# Patient Record
Sex: Female | Born: 1970 | Race: White | Hispanic: No | Marital: Single | Smoking: Current every day smoker
Health system: Southern US, Community
[De-identification: ages and names within clinical notes are randomized; demographics above are authoritative.]

## PROBLEM LIST (undated history)

## (undated) DIAGNOSIS — Z8639 Personal history of other endocrine, nutritional and metabolic disease: Secondary | ICD-10-CM

## (undated) DIAGNOSIS — G4730 Sleep apnea, unspecified: Secondary | ICD-10-CM

## (undated) DIAGNOSIS — I2699 Other pulmonary embolism without acute cor pulmonale: Secondary | ICD-10-CM

## (undated) DIAGNOSIS — M1990 Unspecified osteoarthritis, unspecified site: Secondary | ICD-10-CM

## (undated) DIAGNOSIS — M797 Fibromyalgia: Secondary | ICD-10-CM

## (undated) DIAGNOSIS — J449 Chronic obstructive pulmonary disease, unspecified: Secondary | ICD-10-CM

## (undated) DIAGNOSIS — M069 Rheumatoid arthritis, unspecified: Secondary | ICD-10-CM

## (undated) HISTORY — PX: ABDOMINAL HYSTERECTOMY: SHX81

## (undated) HISTORY — PX: OTHER SURGICAL HISTORY: SHX169

---

## 1999-04-25 DIAGNOSIS — Z86718 Personal history of other venous thrombosis and embolism: Secondary | ICD-10-CM | POA: Insufficient documentation

## 1999-04-25 DIAGNOSIS — I2699 Other pulmonary embolism without acute cor pulmonale: Secondary | ICD-10-CM

## 1999-04-25 HISTORY — DX: Other pulmonary embolism without acute cor pulmonale: I26.99

## 2004-06-17 ENCOUNTER — Ambulatory Visit: Payer: Self-pay | Admitting: Obstetrics and Gynecology

## 2004-07-08 ENCOUNTER — Ambulatory Visit: Payer: Self-pay | Admitting: Obstetrics and Gynecology

## 2005-10-10 ENCOUNTER — Emergency Department: Payer: Self-pay | Admitting: Emergency Medicine

## 2008-09-25 ENCOUNTER — Ambulatory Visit: Payer: Self-pay | Admitting: Internal Medicine

## 2009-03-17 ENCOUNTER — Emergency Department: Payer: Self-pay | Admitting: Emergency Medicine

## 2011-03-14 ENCOUNTER — Ambulatory Visit: Payer: Self-pay | Admitting: Family Medicine

## 2014-01-08 DIAGNOSIS — M79641 Pain in right hand: Secondary | ICD-10-CM | POA: Insufficient documentation

## 2014-01-08 DIAGNOSIS — M222X1 Patellofemoral disorders, right knee: Secondary | ICD-10-CM | POA: Insufficient documentation

## 2014-01-08 DIAGNOSIS — R202 Paresthesia of skin: Secondary | ICD-10-CM | POA: Insufficient documentation

## 2014-01-08 DIAGNOSIS — R29898 Other symptoms and signs involving the musculoskeletal system: Secondary | ICD-10-CM | POA: Insufficient documentation

## 2014-01-15 ENCOUNTER — Ambulatory Visit: Payer: Self-pay

## 2014-01-20 ENCOUNTER — Ambulatory Visit: Payer: Self-pay

## 2014-02-06 DIAGNOSIS — H40059 Ocular hypertension, unspecified eye: Secondary | ICD-10-CM | POA: Insufficient documentation

## 2014-10-14 ENCOUNTER — Encounter: Payer: Self-pay | Admitting: Urgent Care

## 2014-10-14 ENCOUNTER — Emergency Department: Payer: No Typology Code available for payment source

## 2014-10-14 ENCOUNTER — Observation Stay
Admission: EM | Admit: 2014-10-14 | Discharge: 2014-10-15 | Disposition: A | Discharge status: Home / Self Care | Payer: No Typology Code available for payment source | Attending: Internal Medicine | Admitting: Internal Medicine

## 2014-10-14 DIAGNOSIS — Z881 Allergy status to other antibiotic agents status: Secondary | ICD-10-CM | POA: Insufficient documentation

## 2014-10-14 DIAGNOSIS — R05 Cough: Secondary | ICD-10-CM | POA: Insufficient documentation

## 2014-10-14 DIAGNOSIS — E119 Type 2 diabetes mellitus without complications: Secondary | ICD-10-CM | POA: Diagnosis not present

## 2014-10-14 DIAGNOSIS — B349 Viral infection, unspecified: Secondary | ICD-10-CM | POA: Diagnosis present

## 2014-10-14 DIAGNOSIS — R0602 Shortness of breath: Secondary | ICD-10-CM | POA: Insufficient documentation

## 2014-10-14 DIAGNOSIS — R0609 Other forms of dyspnea: Secondary | ICD-10-CM | POA: Diagnosis present

## 2014-10-14 DIAGNOSIS — Z8371 Family history of colonic polyps: Secondary | ICD-10-CM | POA: Diagnosis not present

## 2014-10-14 DIAGNOSIS — M1990 Unspecified osteoarthritis, unspecified site: Secondary | ICD-10-CM | POA: Insufficient documentation

## 2014-10-14 DIAGNOSIS — M069 Rheumatoid arthritis, unspecified: Secondary | ICD-10-CM | POA: Diagnosis not present

## 2014-10-14 DIAGNOSIS — Z8249 Family history of ischemic heart disease and other diseases of the circulatory system: Secondary | ICD-10-CM | POA: Diagnosis not present

## 2014-10-14 DIAGNOSIS — Z79899 Other long term (current) drug therapy: Secondary | ICD-10-CM | POA: Insufficient documentation

## 2014-10-14 DIAGNOSIS — Z800 Family history of malignant neoplasm of digestive organs: Secondary | ICD-10-CM | POA: Insufficient documentation

## 2014-10-14 DIAGNOSIS — Z811 Family history of alcohol abuse and dependence: Secondary | ICD-10-CM | POA: Diagnosis not present

## 2014-10-14 DIAGNOSIS — Z90710 Acquired absence of both cervix and uterus: Secondary | ICD-10-CM | POA: Insufficient documentation

## 2014-10-14 DIAGNOSIS — K7689 Other specified diseases of liver: Secondary | ICD-10-CM | POA: Insufficient documentation

## 2014-10-14 DIAGNOSIS — F17210 Nicotine dependence, cigarettes, uncomplicated: Secondary | ICD-10-CM | POA: Diagnosis not present

## 2014-10-14 DIAGNOSIS — Z86711 Personal history of pulmonary embolism: Secondary | ICD-10-CM | POA: Insufficient documentation

## 2014-10-14 DIAGNOSIS — M797 Fibromyalgia: Secondary | ICD-10-CM | POA: Diagnosis not present

## 2014-10-14 DIAGNOSIS — Z8261 Family history of arthritis: Secondary | ICD-10-CM | POA: Insufficient documentation

## 2014-10-14 DIAGNOSIS — R0789 Other chest pain: Secondary | ICD-10-CM | POA: Insufficient documentation

## 2014-10-14 DIAGNOSIS — Z823 Family history of stroke: Secondary | ICD-10-CM | POA: Diagnosis not present

## 2014-10-14 DIAGNOSIS — R0600 Dyspnea, unspecified: Secondary | ICD-10-CM | POA: Diagnosis not present

## 2014-10-14 DIAGNOSIS — Z808 Family history of malignant neoplasm of other organs or systems: Secondary | ICD-10-CM | POA: Diagnosis not present

## 2014-10-14 DIAGNOSIS — R079 Chest pain, unspecified: Secondary | ICD-10-CM

## 2014-10-14 HISTORY — DX: Other pulmonary embolism without acute cor pulmonale: I26.99

## 2014-10-14 HISTORY — DX: Fibromyalgia: M79.7

## 2014-10-14 HISTORY — DX: Rheumatoid arthritis, unspecified: M06.9

## 2014-10-14 HISTORY — DX: Personal history of other endocrine, nutritional and metabolic disease: Z86.39

## 2014-10-14 HISTORY — DX: Unspecified osteoarthritis, unspecified site: M19.90

## 2014-10-14 LAB — BASIC METABOLIC PANEL
Anion gap: 10 (ref 5–15)
BUN: 16 mg/dL (ref 6–20)
CHLORIDE: 107 mmol/L (ref 101–111)
CO2: 24 mmol/L (ref 22–32)
Calcium: 8.7 mg/dL — ABNORMAL LOW (ref 8.9–10.3)
Creatinine, Ser: 0.97 mg/dL (ref 0.44–1.00)
GFR calc Af Amer: >60 mL/min (ref >60)
GLUCOSE: 133 mg/dL — AB (ref 65–99)
Potassium: 4.1 mmol/L (ref 3.5–5.1)
Sodium: 141 mmol/L (ref 135–145)

## 2014-10-14 LAB — TROPONIN I

## 2014-10-14 LAB — CBC
HEMATOCRIT: 43.7 % (ref 35.0–47.0)
HEMOGLOBIN: 14.6 g/dL (ref 12.0–16.0)
MCH: 30.9 pg (ref 26.0–34.0)
MCHC: 33.4 g/dL (ref 32.0–36.0)
MCV: 92.6 fL (ref 80.0–100.0)
Platelets: 222 10*3/uL (ref 150–440)
RBC: 4.72 MIL/uL (ref 3.80–5.20)
RDW: 14.3 % (ref 11.5–14.5)
WBC: 6.5 10*3/uL (ref 3.6–11.0)

## 2014-10-14 MED ORDER — ASPIRIN 81 MG PO CHEW
324.0000 mg | CHEWABLE_TABLET | Freq: Once | ORAL | Status: AC
  Administered 2014-10-14: 324 mg via ORAL

## 2014-10-14 MED ORDER — SODIUM CHLORIDE 0.9 % IJ SOLN
3.0000 mL | Freq: Two times a day (BID) | INTRAMUSCULAR | Status: DC
  Administered 2014-10-15 (×2): 3 mL via INTRAVENOUS

## 2014-10-14 MED ORDER — DULOXETINE HCL 60 MG PO CPEP
60.0000 mg | ORAL_CAPSULE | Freq: Every day | ORAL | Status: DC
  Administered 2014-10-15: 60 mg via ORAL
  Filled 2014-10-14: qty 1

## 2014-10-14 MED ORDER — ACETAMINOPHEN 325 MG PO TABS
650.0000 mg | ORAL_TABLET | Freq: Four times a day (QID) | ORAL | Status: DC | PRN
  Administered 2014-10-15 (×2): 650 mg via ORAL
  Filled 2014-10-14 (×2): qty 2

## 2014-10-14 MED ORDER — ENOXAPARIN SODIUM 40 MG/0.4ML ~~LOC~~ SOLN
40.0000 mg | Freq: Two times a day (BID) | SUBCUTANEOUS | Status: DC
  Administered 2014-10-15 (×2): 40 mg via SUBCUTANEOUS
  Filled 2014-10-14 (×2): qty 0.4

## 2014-10-14 MED ORDER — ACETAMINOPHEN 650 MG RE SUPP: 650.0000 mg | Freq: Four times a day (QID) | RECTAL | Status: DC | PRN

## 2014-10-14 MED ORDER — ASPIRIN 81 MG PO CHEW
CHEWABLE_TABLET | ORAL | Status: AC
  Administered 2014-10-14: 324 mg via ORAL
  Filled 2014-10-14: qty 4

## 2014-10-14 NOTE — H&P (Signed)
[facility] [place] at [facility]   PATIENT NAME: Dawn Roberson    MR#:  [id number]  DATE OF BIRTH:  1970/09/29  DATE OF ADMISSION:  10/14/2014  PRIMARY CARE PHYSICIAN: No primary care provider on file.   REQUESTING/REFERRING PHYSICIAN: [place]  CHIEF COMPLAINT:   Chief Complaint  Patient presents with  . Chest Pain  . Shortness of Breath    HISTORY OF PRESENT ILLNESS:  Dawn Roberson  is a 44 y.o. female who presents with progressive dyspnea on exertion for the past 3-4 weeks. Patient states that she had bronchitis about 2 months ago treated with antibiotics, then had another upper respiratory tract infection 3-4 weeks ago for a she was given antibiotics but never took them. She states that since that time she's been having worsening dyspnea on exertion with central chest discomfort. She states that initially she started noticing shortness of breath with exertional activities like mowing the lawn, which she had not had previously, but that has progressed now to the point that when she even just walks to her bathroom in her house she becomes winded. She also states that she sometimes will feel her heart beating very fast during these episodes, and that it "feels funny" as well at times. She denies any edema, diaphoresis, radiation to her chest pain, nausea, vomiting, blurred vision, headaches. See full review of systems below. Hospitalists were called for admission for ACS rule out as well as workup of her dyspnea on exertion. Of note she does have a significant smoking history, but has never been diagnosed with lung disease, uses no inhalers, and did not have respiratory problems prior to 3-4 weeks ago.  PAST MEDICAL HISTORY:   Past Medical History  Diagnosis Date  . Fibromyalgia   . Osteoarthritis   . Rheumatoid arthritis   . PE (pulmonary embolism) 2001  . H/O diabetes mellitus     resolved with weight loss    PAST SURGICAL HISTORY:   Past Surgical  History  Procedure Laterality Date  . Abdominal hysterectomy      SOCIAL HISTORY:   History  Substance Use Topics  . Smoking status: Current Every Day Smoker -- 0.50 packs/day for 30 years    Types: Cigarettes  . Smokeless tobacco: Not on file  . Alcohol Use: 0.0 oz/week    0 Standard drinks or equivalent per week     Comment: 1-2 glasses wine, occassional    FAMILY HISTORY:   Family History  Problem Relation Age of Onset  . Rheum arthritis Mother   . Skin cancer Mother   . Osteoarthritis Mother   . Hypertension Mother   . Colon polyps Mother   . Liver cancer Father   . Rheum arthritis Maternal Grandmother   . Alcohol abuse Maternal Grandmother   . Osteoarthritis Maternal Grandmother   . Stroke Maternal Grandmother   . Obesity Maternal Grandmother   . CAD Maternal Grandmother     DRUG ALLERGIES:   Allergies  Allergen Reactions  . Azithromycin Hives    MEDICATIONS AT HOME:   Prior to Admission medications   Medication Sig Start Date End Date Taking? Authorizing Provider  DULoxetine (CYMBALTA) 60 MG capsule Take 60 mg by mouth daily.   Yes Historical Provider, MD  meloxicam (MOBIC) 15 MG tablet Take 15 mg by mouth daily.   Yes Historical Provider, MD    REVIEW OF SYSTEMS:  Review of Systems  Constitutional: Negative for fever, chills, weight loss and malaise/fatigue.  HENT: Negative  for ear pain, hearing loss and tinnitus.   Eyes: Negative for blurred vision, double vision, pain and redness.  Respiratory: Positive for shortness of breath (as dyspnea on exertion). Negative for cough and hemoptysis.   Cardiovascular: Positive for chest pain. Negative for palpitations, orthopnea and leg swelling.  Gastrointestinal: Negative for nausea, vomiting, abdominal pain, diarrhea and constipation.  Genitourinary: Negative for dysuria, frequency and hematuria.  Musculoskeletal: Negative for back pain, joint pain and neck pain.  Skin:       No acne, rash, or lesions   Neurological: Negative for dizziness, tremors, focal weakness and weakness.  Endo/Heme/Allergies: Negative for polydipsia. Does not bruise/bleed easily.  Psychiatric/Behavioral: Negative for depression. The patient is not nervous/anxious and does not have insomnia.      VITAL SIGNS:   Filed Vitals:   10/14/14 1919 10/14/14 2051 10/14/14 2202 10/14/14 2203  BP: 163/105 151/95 144/97 140/105  Pulse: 85 67 70   Temp: 98.4 F (36.9 C)     TempSrc: Oral     Resp: 20 20 20    Height: 5\' 7"  (1.702 m)     Weight: 122.471 kg (270 lb)     SpO2: 97% 95% 98%    Wt Readings from Last 3 Encounters:  10/14/14 122.471 kg (270 lb)    PHYSICAL EXAMINATION:  Physical Exam  Constitutional: She is oriented to person, place, and time. She appears well-developed and well-nourished. No distress.  HENT:  Head: Normocephalic and atraumatic.  Mouth/Throat: Oropharynx is clear and moist.  Eyes: Conjunctivae and EOM are normal. Pupils are equal, round, and reactive to light. No scleral icterus.  Neck: Normal range of motion. Neck supple. No JVD present. No thyromegaly present.  Cardiovascular: Normal rate, regular rhythm and intact distal pulses.  Exam reveals no gallop and no friction rub.   No murmur heard. Respiratory: Effort normal and breath sounds normal. No respiratory distress. She has no wheezes. She has no rales.  GI: Soft. Bowel sounds are normal. She exhibits no distension. There is no tenderness.  Musculoskeletal: Normal range of motion. She exhibits no edema.  No arthritis, no gout  Lymphadenopathy:    She has no cervical adenopathy.  Neurological: She is alert and oriented to person, place, and time. No cranial nerve deficit.  No dysarthria, no aphasia  Skin: Skin is warm and dry. No rash noted. No erythema.  Psychiatric: She has a normal mood and affect. Her behavior is normal. Judgment and thought content normal.    LABORATORY PANEL:   CBC  Recent Labs Lab 10/14/14 1932  WBC  6.5  HGB 14.6  HCT 43.7  PLT 222   ------------------------------------------------------------------------------------------------------------------  Chemistries   Recent Labs Lab 10/14/14 1932  NA 141  K 4.1  CL 107  CO2 24  GLUCOSE 133*  BUN 16  CREATININE 0.97  CALCIUM 8.7*   ------------------------------------------------------------------------------------------------------------------  Cardiac Enzymes  Recent Labs Lab 10/14/14 1932  TROPONINI <0.03   ------------------------------------------------------------------------------------------------------------------  RADIOLOGY:  Dg Chest 2 View  10/14/2014   CLINICAL DATA:  Chest pain, shortness of Breath  EXAM: CHEST  2 VIEW  COMPARISON:  None.  FINDINGS: Cardiomediastinal silhouette is unremarkable. No acute infiltrate or pleural effusion. No pulmonary edema. Mild degenerative changes lower thoracic spine.  IMPRESSION: No active cardiopulmonary disease.   Electronically Signed   By: 10/16/14 M.D.   On: 10/14/2014 19:59    EKG:   Orders placed or performed during the hospital encounter of 10/14/14  . ED EKG (<87mins upon arrival to  the ED)  . ED EKG (<41mins upon arrival to the ED)    IMPRESSION AND PLAN:  Principal Problem:   Dyspnea on exertion - progressive, history seems less likely for ACS and more possibly something like heart failure or cardiomyopathy. We'll trend her enzymes tonight either way, get a cardiology consult in morning, and get an echocardiogram to evaluate cardiac function. Active Problems:   Chest pain - trend cardiac enzymes, negative 1 so far. History seems less typical for ACS. Chest x-ray shows no pulmonary disease or infection.   Fibromyalgia - history of the same, has meloxicam on her home med list, we'll hold this for now until we fully evaluate her cardiac status. We will continue her Cymbalta.   Rheumatoid arthritis - on meloxicam as above, hold for now as above.    All the  records are reviewed and case discussed with ED provider. Management plans discussed with the patient and/or family.  DVT PROPHYLAXIS: SubQ lovenox  ADMISSION STATUS: Observation  CODE STATUS: Full  TOTAL TIME TAKING CARE OF THIS PATIENT: 45 minutes.    Ivor Kishi FIELDING 10/14/2014, 11:09 PM  [facility]  Office  [phone]  CC: Primary care physician; No primary care provider on file.

## 2014-10-14 NOTE — ED Provider Notes (Signed)
[facility] Emergency Department Provider Note  ____________________________________________  Time seen: Approximately 8:51 PM  I have reviewed the triage vital signs and the nursing notes.   HISTORY  Chief Complaint Chest Pain and Shortness of Breath    HPI Dawn Roberson is a 44 y.o. female with a history of fibromyalgia who presents with several weeks of viral symptoms that include congestion and frequent cough both with a gradual onset.  However, she came in today because of approximately 2 days of moderate central chest pressure/pain that is constant but gets worse with with exertion.  She also states that is accompanied with shortness of breath with any sort of exertion which is unusual for her.The symptoms were most pronounced today and she feels like something is wrong because she has not felt like this before.  She does have a history of current tobacco use.  She endorses no other medical history or cardiac risk factors.   Past Medical History  Diagnosis Date  . Fibromyalgia   . Osteoarthritis   . Rheumatoid arthritis     There are no active problems to display for this patient.   Past Surgical History  Procedure Laterality Date  . Abdominal hysterectomy      No current outpatient prescriptions on file.  Allergies Azithromycin  No family history on file.  Social History History  Substance Use Topics  . Smoking status: Current Every Day Smoker  . Smokeless tobacco: Not on file  . Alcohol Use: Yes    Review of Systems Constitutional: No fever/chills Eyes: No visual changes. ENT: No sore throat. Cardiovascular: Moderate central chest pressure/pain that is constant Respiratory: Increasingly severe shortness of breath particularly with exertion Gastrointestinal: No abdominal pain.  No nausea, no vomiting.  No diarrhea.  No constipation. Genitourinary: Negative for dysuria. Musculoskeletal: Negative for back pain. Skin:  Negative for rash. Neurological: Moderate headache, no focal weakness or numbness.  10-point ROS otherwise negative.  ____________________________________________   PHYSICAL EXAM:  VITAL SIGNS: ED Triage Vitals  Enc Vitals Group     BP 10/14/14 1919 163/105 mmHg     Pulse Rate 10/14/14 1919 85     Resp 10/14/14 1919 20     Temp 10/14/14 1919 98.4 F (36.9 C)     Temp Source 10/14/14 1919 Oral     SpO2 10/14/14 1919 97 %     Weight 10/14/14 1919 270 lb (122.471 kg)     Height 10/14/14 1919 5\' 7"  (1.702 m)     Head Cir --      Peak Flow --      Pain Score 10/14/14 1923 8     Pain Loc --      Pain Edu? --      Excl. in GC? --     Constitutional: Alert and oriented. Well appearing and in no acute distress.  Does appear mildly uncomfortable Eyes: Conjunctivae are normal. PERRL. EOMI. Head: Atraumatic. Nose: No congestion/rhinnorhea. Mouth/Throat: Mucous membranes are moist.  Oropharynx non-erythematous. Neck: No stridor.   Cardiovascular: Normal rate, regular rhythm. Grossly normal heart sounds.  Good peripheral circulation. Respiratory: Normal respiratory effort.  No retractions. Lungs CTAB. Gastrointestinal: Obese, Soft and nontender. No distention. No abdominal bruits. No CVA tenderness. Musculoskeletal: No lower extremity tenderness nor edema.  No joint effusions. Neurologic:  Normal speech and language. No gross focal neurologic deficits are appreciated. Speech is normal. Skin:  Skin is warm, dry and intact. No rash noted.   ____________________________________________   LABS (all  labs ordered are listed, but only abnormal results are displayed)  Labs Reviewed  BASIC METABOLIC PANEL - Abnormal; Notable for the following:    Glucose, Bld 133 (*)    Calcium 8.7 (*)    All other components within normal limits  CBC  TROPONIN I  CBC  CREATININE, SERUM  TSH  TROPONIN I  TROPONIN I  TROPONIN I  BASIC METABOLIC PANEL  CBC  BRAIN NATRIURETIC PEPTIDE    ____________________________________________  EKG  ED ECG REPORT I, Andrika Peraza, the attending physician, personally viewed and interpreted this ECG.   Date: 10/14/2014  EKG Time: 19:29  Rate: 76  Rhythm: normal EKG, normal sinus rhythm  Axis: Normal  Intervals:Normal  ST&T Change: None  ____________________________________________  RADIOLOGY  I, Tywanna Seifer, personally viewed and evaluated these images as part of my medical decision making.   Dg Chest 2 View  10/14/2014   CLINICAL DATA:  Chest pain, shortness of Breath  EXAM: CHEST  2 VIEW  COMPARISON:  None.  FINDINGS: Cardiomediastinal silhouette is unremarkable. No acute infiltrate or pleural effusion. No pulmonary edema. Mild degenerative changes lower thoracic spine.  IMPRESSION: No active cardiopulmonary disease.   Electronically Signed   By: Natasha Mead M.D.   On: 10/14/2014 19:59    ____________________________________________   PROCEDURES  Procedure(s) performed: None  Critical Care performed: No ____________________________________________   INITIAL IMPRESSION / ASSESSMENT AND PLAN / ED COURSE  Pertinent labs & imaging results that were available during my care of the patient were reviewed by me and considered in my medical decision making (see chart for details).  I do not believe that this patient's symptoms represent ACS; her EKG and initial troponin are reassuring.  Similarly, I think that a thromboembolic event is unlikely; her chest pain is atypical rather than pleuritic, she is not tachycardic or hypoxemic, and the dyspnea on exertion is more consistent with a possible cardiomyopathy or myocarditis than a PE,  and given the recent viral infection I am concerned about both of these possibilities.  I discussed this patient with Dr. Anne Hahn, the hospitalist, and he agreed to observe the patient in the hospital for additional cardiac workup including an echocardiogram.  I discussed plan with the patient  who understands and agrees.  She received a full dose aspirin while in the emergency department.  ____________________________________________  FINAL CLINICAL IMPRESSION(S) / ED DIAGNOSES  Final diagnoses:  Atypical chest pain  Dyspnea on exertion  Viral syndrome      NEW MEDICATIONS STARTED DURING THIS VISIT:  New Prescriptions   No medications on file     Loleta Rose, MD 10/14/14 2353

## 2014-10-14 NOTE — ED Notes (Signed)
Patient presents with c/o a 2 week history of chest pain and SOB. Patient advising that it is getting worse. Has been recently treated for a URI. (+) DOE reported with minimal activity.

## 2014-10-14 NOTE — ED Notes (Signed)
C/o chest pain for some time now.  Pain is pressure and tightness in chest.  C/o DOE even just walking to the bathroom causes shortness of breath and feels like heart is racing.

## 2014-10-15 ENCOUNTER — Observation Stay: Payer: No Typology Code available for payment source

## 2014-10-15 LAB — CBC
HCT: 41.5 % (ref 35.0–47.0)
HCT: 43.4 % (ref 35.0–47.0)
HEMOGLOBIN: 14 g/dL (ref 12.0–16.0)
Hemoglobin: 14.5 g/dL (ref 12.0–16.0)
MCH: 30.6 pg (ref 26.0–34.0)
MCH: 31 pg (ref 26.0–34.0)
MCHC: 33.3 g/dL (ref 32.0–36.0)
MCHC: 33.7 g/dL (ref 32.0–36.0)
MCV: 91.9 fL (ref 80.0–100.0)
MCV: 92 fL (ref 80.0–100.0)
PLATELETS: 227 10*3/uL (ref 150–440)
Platelets: 196 10*3/uL (ref 150–440)
RBC: 4.51 MIL/uL (ref 3.80–5.20)
RBC: 4.73 MIL/uL (ref 3.80–5.20)
RDW: 14.3 % (ref 11.5–14.5)
RDW: 14.5 % (ref 11.5–14.5)
WBC: 5.4 10*3/uL (ref 3.6–11.0)
WBC: 7 10*3/uL (ref 3.6–11.0)

## 2014-10-15 LAB — BASIC METABOLIC PANEL
Anion gap: 8 (ref 5–15)
BUN: 13 mg/dL (ref 6–20)
CO2: 25 mmol/L (ref 22–32)
Calcium: 8.5 mg/dL — ABNORMAL LOW (ref 8.9–10.3)
Chloride: 108 mmol/L (ref 101–111)
Creatinine, Ser: 0.72 mg/dL (ref 0.44–1.00)
GFR calc Af Amer: >60 mL/min (ref >60)
GFR calc non Af Amer: >60 mL/min (ref >60)
Glucose, Bld: 93 mg/dL (ref 65–99)
Potassium: 3.9 mmol/L (ref 3.5–5.1)
SODIUM: 141 mmol/L (ref 135–145)

## 2014-10-15 LAB — CREATININE, SERUM
CREATININE: 0.77 mg/dL (ref 0.44–1.00)
GFR calc non Af Amer: >60 mL/min (ref >60)

## 2014-10-15 LAB — TROPONIN I
Troponin I: <0.03 ng/mL (ref <0.031)
Troponin I: <0.03 ng/mL (ref <0.031)
Troponin I: <0.03 ng/mL (ref <0.031)

## 2014-10-15 LAB — BRAIN NATRIURETIC PEPTIDE: B NATRIURETIC PEPTIDE 5: 11 pg/mL (ref 0.0–100.0)

## 2014-10-15 LAB — TSH: TSH: 4.988 u[IU]/mL — ABNORMAL HIGH (ref 0.350–4.500)

## 2014-10-15 MED ORDER — IOHEXOL 350 MG/ML SOLN
100.0000 mL | Freq: Once | INTRAVENOUS | Status: AC | PRN
  Administered 2014-10-15: 100 mL via INTRAVENOUS

## 2014-10-15 NOTE — Care Management (Signed)
Order present for CM assessment for discharge planning.  Patient admitted with chest pain.  Troponins are negative .  Cardiology recommending discharge and outpatient stress test in office 6/24.   Patient presents from home and independent in all adls.   Has health insurance.  Denies issues accessing medical care, obtaining medications, maintaining housing, utilities and food.   No discharge needs identified at present time.

## 2014-10-15 NOTE — Progress Notes (Addendum)
Patient given discharge teaching and paperwork regarding medications, diet, follow-up appointments and activity. Patient understanding verbalized. No complaints at this time. IV and telemetry discontinued. Skin assessment as previously charted and vitals are stable. Patient being discharged to home. Family present during discharge teaching. No needs from Care Management.   Dawn Roberson, Dawn Roberson   

## 2014-10-15 NOTE — Progress Notes (Signed)
Dawn Roberson is a 44 y.o. female  [id number]  Primary Cardiologist: Adrian Blackwater Reason for Consultation: Chest pain  HPI: This is a 44 year old pleasant white female who presented to the emergency room with chest pain. Patient has a history of bronchitis for the past 2 months and been treated with various antibiotics but developed severe pressure type chest pain yesterday associated with shortness of breath. Patient denies any chest pain right now but is experiencing some shortness of breath. Patient also has history of fibromyalgia osteoarthritis and rheumatoid arthritis   Review of Systems: Review of Systems  Respiratory: Positive for cough and shortness of breath.   Cardiovascular: Positive for chest pain and orthopnea.  All other systems reviewed and are negative.     Past Medical History  Diagnosis Date  . Fibromyalgia   . Osteoarthritis   . Rheumatoid arthritis   . PE (pulmonary embolism) 2001  . H/O diabetes mellitus     resolved with weight loss    Medications Prior to Admission  Medication Sig Dispense Refill  . DULoxetine (CYMBALTA) 60 MG capsule Take 60 mg by mouth daily.    . meloxicam (MOBIC) 15 MG tablet Take 15 mg by mouth daily.       . DULoxetine  60 mg Oral Daily  . enoxaparin (LOVENOX) injection  40 mg Subcutaneous BID  . sodium chloride  3 mL Intravenous Q12H    Infusions:    Allergies  Allergen Reactions  . Azithromycin Hives    History   Social History  . Marital Status: Single    Spouse Name: N/A  . Number of Children: N/A  . Years of Education: N/A   Occupational History  . Not on file.   Social History Main Topics  . Smoking status: Current Every Day Smoker -- 0.50 packs/day for 30 years    Types: Cigarettes  . Smokeless tobacco: Not on file  . Alcohol Use: 0.0 oz/week    0 Standard drinks or equivalent per week     Comment: 1-2 glasses wine, occassional  . Drug Use: No  . Sexual Activity: Yes   Other Topics  Concern  . Not on file   Social History Narrative    Family History  Problem Relation Age of Onset  . Rheum arthritis Mother   . Skin cancer Mother   . Osteoarthritis Mother   . Hypertension Mother   . Colon polyps Mother   . Liver cancer Father   . Rheum arthritis Maternal Grandmother   . Alcohol abuse Maternal Grandmother   . Osteoarthritis Maternal Grandmother   . Stroke Maternal Grandmother   . Obesity Maternal Grandmother   . CAD Maternal Grandmother     PHYSICAL EXAM: Filed Vitals:   10/15/14 0448  BP: 119/43  Pulse: 64  Temp: 98.5 F (36.9 C)  Resp: 18     Intake/Output Summary (Last 24 hours) at 10/15/14 0838 Last data filed at 10/15/14 0455  Gross per 24 hour  Intake      0 ml  Output    650 ml  Net   -650 ml    General:  Well appearing. No respiratory difficulty HEENT: normal Neck: supple. no JVD. Carotids 2+ bilat; no bruits. No lymphadenopathy or thryomegaly appreciated. Cor: PMI nondisplaced. Regular rate & rhythm. No rubs, gallops or murmurs. Lungs: clear Abdomen: soft, nontender, nondistended. No hepatosplenomegaly. No bruits or masses. Good bowel sounds. Extremities: no cyanosis, clubbing, rash, edema Neuro: alert & oriented x 3, cranial  nerves grossly intact. moves all 4 extremities w/o difficulty. Affect pleasant.  ECG: EKG shows normal sinus rhythm 76 bpm otherwise within normal limits  Results for orders placed or performed during the hospital encounter of 10/14/14 (from the past 24 hour(s))  CBC     Status: None   Collection Time: 10/14/14  7:32 PM  Result Value Ref Range   WBC 6.5 3.6 - 11.0 K/uL   RBC 4.72 3.80 - 5.20 MIL/uL   Hemoglobin 14.6 12.0 - 16.0 g/dL   HCT 55.2 08.0 - 22.3 %   MCV 92.6 80.0 - 100.0 fL   MCH 30.9 26.0 - 34.0 pg   MCHC 33.4 32.0 - 36.0 g/dL   RDW 36.1 22.4 - 49.7 %   Platelets 222 150 - 440 K/uL  Basic metabolic panel     Status: Abnormal   Collection Time: 10/14/14  7:32 PM  Result Value Ref Range    Sodium 141 135 - 145 mmol/L   Potassium 4.1 3.5 - 5.1 mmol/L   Chloride 107 101 - 111 mmol/L   CO2 24 22 - 32 mmol/L   Glucose, Bld 133 (H) 65 - 99 mg/dL   BUN 16 6 - 20 mg/dL   Creatinine, Ser 5.30 0.44 - 1.00 mg/dL   Calcium 8.7 (L) 8.9 - 10.3 mg/dL   GFR calc non Af Amer >60 >60 mL/min   GFR calc Af Amer >60 >60 mL/min   Anion gap 10 5 - 15  Troponin I     Status: None   Collection Time: 10/14/14  7:32 PM  Result Value Ref Range   Troponin I <0.03 <0.031 ng/mL  CBC     Status: None   Collection Time: 10/14/14 11:39 PM  Result Value Ref Range   WBC 7.0 3.6 - 11.0 K/uL   RBC 4.73 3.80 - 5.20 MIL/uL   Hemoglobin 14.5 12.0 - 16.0 g/dL   HCT 05.1 10.2 - 11.1 %   MCV 91.9 80.0 - 100.0 fL   MCH 30.6 26.0 - 34.0 pg   MCHC 33.3 32.0 - 36.0 g/dL   RDW 73.5 67.0 - 14.1 %   Platelets 227 150 - 440 K/uL  Creatinine, serum     Status: None   Collection Time: 10/14/14 11:39 PM  Result Value Ref Range   Creatinine, Ser 0.77 0.44 - 1.00 mg/dL   GFR calc non Af Amer >60 >60 mL/min   GFR calc Af Amer >60 >60 mL/min  TSH     Status: Abnormal   Collection Time: 10/14/14 11:39 PM  Result Value Ref Range   TSH 4.988 (H) 0.350 - 4.500 uIU/mL  Troponin I     Status: None   Collection Time: 10/14/14 11:39 PM  Result Value Ref Range   Troponin I <0.03 <0.031 ng/mL  Brain natriuretic peptide     Status: None   Collection Time: 10/14/14 11:39 PM  Result Value Ref Range   B Natriuretic Peptide 11.0 0.0 - 100.0 pg/mL  Troponin I     Status: None   Collection Time: 10/15/14  4:58 AM  Result Value Ref Range   Troponin I <0.03 <0.031 ng/mL  Basic metabolic panel     Status: Abnormal   Collection Time: 10/15/14  4:58 AM  Result Value Ref Range   Sodium 141 135 - 145 mmol/L   Potassium 3.9 3.5 - 5.1 mmol/L   Chloride 108 101 - 111 mmol/L   CO2 25 22 - 32 mmol/L   Glucose,  Bld 93 65 - 99 mg/dL   BUN 13 6 - 20 mg/dL   Creatinine, Ser 7.85 0.44 - 1.00 mg/dL   Calcium 8.5 (L) 8.9 - 10.3 mg/dL    GFR calc non Af Amer >60 >60 mL/min   GFR calc Af Amer >60 >60 mL/min   Anion gap 8 5 - 15  CBC     Status: None   Collection Time: 10/15/14  4:58 AM  Result Value Ref Range   WBC 5.4 3.6 - 11.0 K/uL   RBC 4.51 3.80 - 5.20 MIL/uL   Hemoglobin 14.0 12.0 - 16.0 g/dL   HCT 88.5 02.7 - 74.1 %   MCV 92.0 80.0 - 100.0 fL   MCH 31.0 26.0 - 34.0 pg   MCHC 33.7 32.0 - 36.0 g/dL   RDW 28.7 86.7 - 67.2 %   Platelets 196 150 - 440 K/uL   Dg Chest 2 View  10/14/2014   CLINICAL DATA:  Chest pain, shortness of Breath  EXAM: CHEST  2 VIEW  COMPARISON:  None.  FINDINGS: Cardiomediastinal silhouette is unremarkable. No acute infiltrate or pleural effusion. No pulmonary edema. Mild degenerative changes lower thoracic spine.  IMPRESSION: No active cardiopulmonary disease.   Electronically Signed   By: Natasha Mead M.D.   On: 10/14/2014 19:59     ASSESSMENT AND PLAN: Atypical chest pain most likely secondary to fibromyalgia or bronc and chronic bronchitis. Patient however has severe shortness of breath and pressure type chest pain occasionally. Is hard to discern with whether this is cardiac or pulmonary or related to fibromyalgia. Patient however has ruled out for myocardial infarction by serial cardiac enzymes and EKG is normal. Advise discharging the patient with follow-up echocardiogram and stress test in our office tomorrow at 8:30 AM patient was given instructions for all that and she will go-she'll be discharged with follow-up in the office.  Verlon Pischke A

## 2014-10-15 NOTE — Discharge Instructions (Signed)
°  DIET:  Follow instructions from Dr. Welton Flakes regarding diet for today prior to stress test in am.  DISCHARGE CONDITION:  Stable  ACTIVITY:  Activity as tolerated  OXYGEN:  Home Oxygen: No.   Oxygen Delivery: room air  DISCHARGE LOCATION:  home   If you experience worsening of your admission symptoms, develop shortness of breath, life threatening emergency, suicidal or homicidal thoughts you must seek medical attention immediately by calling 911 or calling your MD immediately  if symptoms less severe.  You Must read complete instructions/literature along with all the possible adverse reactions/side effects for all the Medicines you take and that have been prescribed to you. Take any new Medicines after you have completely understood and accpet all the possible adverse reactions/side effects.   Please note  You were cared for by a hospitalist during your hospital stay. If you have any questions about your discharge medications or the care you received while you were in the hospital after you are discharged, you can call the unit and asked to speak with the hospitalist on call if the hospitalist that took care of you is not available. Once you are discharged, your primary care physician will handle any further medical issues. Please note that NO REFILLS for any discharge medications will be authorized once you are discharged, as it is imperative that you return to your primary care physician (or establish a relationship with a primary care physician if you do not have one) for your aftercare needs so that they can reassess your need for medications and monitor your lab values.

## 2014-10-15 NOTE — Discharge Summary (Signed)
[facility] - [place] at [facility]  DISCHARGE SUMMARY   PATIENT NAME: Dawn Roberson    MR#:  [id number]  DATE OF BIRTH:  1971-02-04  DATE OF ADMISSION:  10/14/2014 ADMITTING PHYSICIAN: Oralia Manis, MD  DATE OF DISCHARGE: No discharge date for patient encounter.  PRIMARY CARE PHYSICIAN: No primary care provider on file.    ADMISSION DIAGNOSIS:  Viral syndrome [B34.9] Dyspnea on exertion [R06.09] Atypical chest pain [R07.89]  DISCHARGE DIAGNOSIS:  Principal Problem:   Dyspnea on exertion Active Problems:   Chest pain   Fibromyalgia   Rheumatoid arthritis   DOE (dyspnea on exertion)   SECONDARY DIAGNOSIS:   Past Medical History  Diagnosis Date  . Fibromyalgia   . Osteoarthritis   . Rheumatoid arthritis   . PE (pulmonary embolism) 2001  . H/O diabetes mellitus     resolved with weight loss    HOSPITAL COURSE:   1) dyspnea on exertion:  Progressive dyspnea on exertion over the past few weeks. Oxygenation is excellent on room air. She ruled out for acute coronary syndrome. CT angiogram shows no PE, no edema or consolidation. She will follow up with cardiology tomorrow for 2-D echocardiogram and stress testing. If this is normal she will need to have pulmonary function testing.  #2 chest pain: Cardiac enzymes negative. History not typical for ACS. Will be following up with cardiology in the morning.  #3 fibromyalgia: Continue with meloxicam.  #4 fibromyalgia: Continue meloxicam.  DISCHARGE CONDITIONS:   stable  CONSULTS OBTAINED:     Cardiology, Dr. Adrian Blackwater  DRUG ALLERGIES:   Allergies  Allergen Reactions  . Azithromycin Hives    DISCHARGE MEDICATIONS:   Current Discharge Medication List    CONTINUE these medications which have NOT CHANGED   Details  DULoxetine (CYMBALTA) 60 MG capsule Take 60 mg by mouth daily.    meloxicam (MOBIC) 15 MG tablet Take 15 mg by mouth daily.         DISCHARGE INSTRUCTIONS:     DIET:  Follow instructions from Dr. Welton Flakes regarding diet for today prior to stress test in am.  DISCHARGE CONDITION:  Stable  ACTIVITY:  Activity as tolerated  OXYGEN:  Home Oxygen: No.   Oxygen Delivery: room air  DISCHARGE LOCATION:  home   If you experience worsening of your admission symptoms, develop shortness of breath, life threatening emergency, suicidal or homicidal thoughts you must seek medical attention immediately by calling 911 or calling your MD immediately  if symptoms less severe.  You Must read complete instructions/literature along with all the possible adverse reactions/side effects for all the Medicines you take and that have been prescribed to you. Take any new Medicines after you have completely understood and accpet all the possible adverse reactions/side effects.   Please note  You were cared for by a hospitalist during your hospital stay. If you have any questions about your discharge medications or the care you received while you were in the hospital after you are discharged, you can call the unit and asked to speak with the hospitalist on call if the hospitalist that took care of you is not available. Once you are discharged, your primary care physician will handle any further medical issues. Please note that NO REFILLS for any discharge medications will be authorized once you are discharged, as it is imperative that you return to your primary care physician (or establish a relationship with a primary care physician if you do not have one) for your aftercare  needs so that they can reassess your need for medications and monitor your lab values.    Today   CHIEF COMPLAINT:   Chief Complaint  Patient presents with  . Chest Pain  . Shortness of Breath    HISTORY OF PRESENT ILLNESS:  Dawn Roberson is a 44 y.o. female who presents with progressive dyspnea on exertion for the past 3-4 weeks. Patient states that she had bronchitis about 2 months ago  treated with antibiotics, then had another upper respiratory tract infection 3-4 weeks ago for a she was given antibiotics but never took them. She states that since that time she's been having worsening dyspnea on exertion with central chest discomfort. She states that initially she started noticing shortness of breath with exertional activities like mowing the lawn, which she had not had previously, but that has progressed now to the point that when she even just walks to her bathroom in her house she becomes winded. She also states that she sometimes will feel her heart beating very fast during these episodes, and that it "feels funny" as well at times. She denies any edema, diaphoresis, radiation to her chest pain, nausea, vomiting, blurred vision, headaches. See full review of systems below. Hospitalists were called for admission for ACS rule out as well as workup of her dyspnea on exertion. Of note she does have a significant smoking history, but has never been diagnosed with lung disease, uses no inhalers, and did not have respiratory problems prior to 3-4 weeks ago.  VITAL SIGNS:  Blood pressure 129/73, pulse 59, temperature 98.4 F (36.9 C), temperature source Oral, resp. rate 16, height 5\' 7"  (1.702 m), weight 119.931 kg (264 lb 6.4 oz), SpO2 99 %.  I/O:   Intake/Output Summary (Last 24 hours) at 10/15/14 1358 Last data filed at 10/15/14 1258  Gross per 24 hour  Intake    480 ml  Output   1750 ml  Net  -1270 ml    PHYSICAL EXAMINATION:  GENERAL:  44 y.o.-year-old patient lying in the bed with no acute distress. Obese EYES: Pupils equal, round, reactive to light and accommodation. No scleral icterus. Extraocular muscles intact.  HEENT: Head atraumatic, normocephalic. Oropharynx and nasopharynx clear.  NECK:  Supple, no jugular venous distention. No thyroid enlargement, no tenderness.  LUNGS: Normal breath sounds bilaterally, no wheezing, rales, rhonchi or crepitation. No use of  accessory muscles of respiration.  CARDIOVASCULAR: S1, S2 normal. No murmurs, rubs, or gallops.  ABDOMEN: Soft, non-tender, non-distended. Bowel sounds present. No organomegaly or mass.  EXTREMITIES: No pedal edema, cyanosis, or clubbing.  NEUROLOGIC: Cranial nerves II through XII are intact. Muscle strength 5/5 in all extremities. Sensation intact. Gait not checked.  PSYCHIATRIC: The patient is alert and oriented x 3.  SKIN: No obvious rash, lesion, or ulcer.   DATA REVIEW:   CBC  Recent Labs Lab 10/15/14 0458  WBC 5.4  HGB 14.0  HCT 41.5  PLT 196    Chemistries   Recent Labs Lab 10/15/14 0458  NA 141  K 3.9  CL 108  CO2 25  GLUCOSE 93  BUN 13  CREATININE 0.72  CALCIUM 8.5*    Cardiac Enzymes  Recent Labs Lab 10/15/14 1201  TROPONINI <0.03    Microbiology Results  No results found for this or any previous visit.  RADIOLOGY:  Dg Chest 2 View  10/14/2014   CLINICAL DATA:  Chest pain, shortness of Breath  EXAM: CHEST  2 VIEW  COMPARISON:  None.  FINDINGS:  Cardiomediastinal silhouette is unremarkable. No acute infiltrate or pleural effusion. No pulmonary edema. Mild degenerative changes lower thoracic spine.  IMPRESSION: No active cardiopulmonary disease.   Electronically Signed   By: Natasha Mead M.D.   On: 10/14/2014 19:59   Ct Angio Chest Pe W/cm &/or Wo Cm  10/15/2014   CLINICAL DATA:  Shortness of breath and chest pain  EXAM: CT ANGIOGRAPHY CHEST WITH CONTRAST  TECHNIQUE: Multidetector CT imaging of the chest was performed using the standard protocol during bolus administration of intravenous contrast. Multiplanar CT image reconstructions and MIPs were obtained to evaluate the vascular anatomy.  CONTRAST:  OMNIPAQUE IOHEXOL 350 MG/ML SOLN  COMPARISON:  Chest radiograph October 14, 2014  FINDINGS: There is no demonstrable pulmonary embolus. There is no thoracic aortic aneurysm or dissection.  There is no lung edema or consolidation.  Visualized thyroid appears  normal. There is no demonstrable thoracic adenopathy. The pericardium is not thickened.  In the visualized upper abdomen, there is a 5 mm probable cyst in the posterior segment right lobe of the liver. There is slight left adrenal hypertrophy.  There is degenerative change in the thoracic spine. There are no blastic or lytic bone lesions.  Review of the MIP images confirms the above findings.  IMPRESSION: No demonstrable pulmonary embolus. No lung edema or consolidation. No adenopathy. Slight left adrenal hypertrophy.   Electronically Signed   By: Bretta Bang III M.D.   On: 10/15/2014 11:59    EKG:   Orders placed or performed during the hospital encounter of 10/14/14  . ED EKG (<53mins upon arrival to the ED)  . ED EKG (<52mins upon arrival to the ED)  . EKG 12-Lead  . EKG 12-Lead      Management plans discussed with the patient, family and they are in agreement.  CODE STATUS:     Code Status Orders        Start     Ordered   10/14/14 2337  Full code   Continuous     10/14/14 2336      TOTAL TIME TAKING CARE OF THIS PATIENT: 40 minutes.    Elby Showers M.D on 10/15/2014 at 1:58 PM  Between 7am to 6pm - Pager - [phone]  After 6pm go to [email or web address] - password [email or web address] [facility]  [facility]  Office  [phone]  CC: Primary care physician; No primary care provider on file.

## 2014-10-20 ENCOUNTER — Encounter: Payer: Self-pay | Admitting: *Deleted

## 2014-10-20 ENCOUNTER — Ambulatory Visit
Admission: RE | Admit: 2014-10-20 | Discharge: 2014-10-20 | Disposition: A | Discharge status: Home / Self Care | Payer: No Typology Code available for payment source | Source: Ambulatory Visit | Attending: Cardiovascular Disease | Admitting: Cardiovascular Disease

## 2014-10-20 ENCOUNTER — Encounter
Admission: RE | Disposition: A | Discharge status: Home / Self Care | Payer: Self-pay | Source: Ambulatory Visit | Attending: Cardiovascular Disease

## 2014-10-20 DIAGNOSIS — Z8249 Family history of ischemic heart disease and other diseases of the circulatory system: Secondary | ICD-10-CM | POA: Diagnosis not present

## 2014-10-20 DIAGNOSIS — Z79899 Other long term (current) drug therapy: Secondary | ICD-10-CM | POA: Insufficient documentation

## 2014-10-20 DIAGNOSIS — Z791 Long term (current) use of non-steroidal anti-inflammatories (NSAID): Secondary | ICD-10-CM | POA: Insufficient documentation

## 2014-10-20 DIAGNOSIS — R079 Chest pain, unspecified: Secondary | ICD-10-CM | POA: Diagnosis not present

## 2014-10-20 DIAGNOSIS — F17200 Nicotine dependence, unspecified, uncomplicated: Secondary | ICD-10-CM | POA: Insufficient documentation

## 2014-10-20 DIAGNOSIS — K219 Gastro-esophageal reflux disease without esophagitis: Secondary | ICD-10-CM | POA: Insufficient documentation

## 2014-10-20 DIAGNOSIS — M797 Fibromyalgia: Secondary | ICD-10-CM | POA: Insufficient documentation

## 2014-10-20 HISTORY — PX: CARDIAC CATHETERIZATION: SHX172

## 2014-10-20 SURGERY — LEFT HEART CATH
Anesthesia: Moderate Sedation

## 2014-10-20 MED ORDER — ONDANSETRON HCL 4 MG/2ML IJ SOLN: 4.0000 mg | Freq: Four times a day (QID) | INTRAMUSCULAR | Status: DC | PRN

## 2014-10-20 MED ORDER — HEPARIN (PORCINE) IN NACL 2-0.9 UNIT/ML-% IJ SOLN
INTRAMUSCULAR | Status: AC
  Filled 2014-10-20: qty 500

## 2014-10-20 MED ORDER — SODIUM CHLORIDE 0.9 % WEIGHT BASED INFUSION: 3.0000 mL/kg/h | INTRAVENOUS | Status: DC

## 2014-10-20 MED ORDER — SODIUM CHLORIDE 0.9 % IJ SOLN: 3.0000 mL | Freq: Two times a day (BID) | INTRAMUSCULAR | Status: DC

## 2014-10-20 MED ORDER — FENTANYL CITRATE (PF) 100 MCG/2ML IJ SOLN
INTRAMUSCULAR | Status: DC | PRN
  Administered 2014-10-20 (×2): 50 ug via INTRAVENOUS

## 2014-10-20 MED ORDER — ACETAMINOPHEN 325 MG PO TABS: 650.0000 mg | ORAL_TABLET | ORAL | Status: DC | PRN

## 2014-10-20 MED ORDER — SODIUM CHLORIDE 0.9 % IV SOLN
INTRAVENOUS | Status: DC
  Administered 2014-10-20: 12:00:00 via INTRAVENOUS

## 2014-10-20 MED ORDER — FENTANYL CITRATE (PF) 100 MCG/2ML IJ SOLN
INTRAMUSCULAR | Status: AC
  Filled 2014-10-20: qty 2

## 2014-10-20 MED ORDER — IOHEXOL 300 MG/ML  SOLN
INTRAMUSCULAR | Status: DC | PRN
  Administered 2014-10-20: 30 mL via INTRA_ARTERIAL
  Administered 2014-10-20: 50 mL via INTRA_ARTERIAL

## 2014-10-20 MED ORDER — SODIUM CHLORIDE 0.9 % IV SOLN: 250.0000 mL | INTRAVENOUS | Status: DC | PRN

## 2014-10-20 MED ORDER — LIDOCAINE HCL (PF) 1 % IJ SOLN
INTRAMUSCULAR | Status: AC
  Filled 2014-10-20: qty 30

## 2014-10-20 MED ORDER — MIDAZOLAM HCL 2 MG/2ML IJ SOLN
INTRAMUSCULAR | Status: AC
  Filled 2014-10-20: qty 2

## 2014-10-20 MED ORDER — SODIUM CHLORIDE 0.9 % IJ SOLN
3.0000 mL | Freq: Two times a day (BID) | INTRAMUSCULAR | Status: DC
  Administered 2014-10-20: 3 mL via INTRAVENOUS

## 2014-10-20 MED ORDER — SODIUM CHLORIDE 0.9 % IJ SOLN: 3.0000 mL | INTRAMUSCULAR | Status: DC | PRN

## 2014-10-20 MED ORDER — MIDAZOLAM HCL 2 MG/2ML IJ SOLN
INTRAMUSCULAR | Status: DC | PRN
  Administered 2014-10-20 (×2): 1 mg via INTRAVENOUS

## 2014-10-20 SURGICAL SUPPLY — 9 items
CATH INFINITI 5FR ANG PIGTAIL (CATHETERS) ×3 IMPLANT
CATH INFINITI 5FR JL4 (CATHETERS) ×3 IMPLANT
CATH INFINITI JR4 5F (CATHETERS) ×3 IMPLANT
DEVICE CLOSURE MYNXGRIP 5F (Vascular Products) ×3 IMPLANT
KIT MANI 3VAL PERCEP (MISCELLANEOUS) ×3 IMPLANT
NEEDLE PERC 18GX7CM (NEEDLE) ×3 IMPLANT
PACK CARDIAC CATH (CUSTOM PROCEDURE TRAY) ×3 IMPLANT
SHEATH PINNACLE 5F 10CM (SHEATH) ×3 IMPLANT
WIRE EMERALD 3MM-J .035X150CM (WIRE) ×3 IMPLANT

## 2014-10-20 NOTE — Discharge Instructions (Signed)

## 2014-12-13 DIAGNOSIS — J449 Chronic obstructive pulmonary disease, unspecified: Secondary | ICD-10-CM | POA: Insufficient documentation

## 2015-03-23 DIAGNOSIS — F331 Major depressive disorder, recurrent, moderate: Secondary | ICD-10-CM | POA: Insufficient documentation

## 2015-03-23 DIAGNOSIS — R5382 Chronic fatigue, unspecified: Secondary | ICD-10-CM | POA: Insufficient documentation

## 2015-04-27 DIAGNOSIS — R002 Palpitations: Secondary | ICD-10-CM | POA: Insufficient documentation

## 2015-04-28 DIAGNOSIS — R000 Tachycardia, unspecified: Secondary | ICD-10-CM | POA: Insufficient documentation

## 2015-04-28 DIAGNOSIS — Z86711 Personal history of pulmonary embolism: Secondary | ICD-10-CM | POA: Insufficient documentation

## 2016-01-03 DIAGNOSIS — R768 Other specified abnormal immunological findings in serum: Secondary | ICD-10-CM | POA: Insufficient documentation

## 2016-02-17 ENCOUNTER — Other Ambulatory Visit: Payer: Self-pay | Admitting: Family Medicine

## 2016-02-17 DIAGNOSIS — R2242 Localized swelling, mass and lump, left lower limb: Secondary | ICD-10-CM

## 2016-08-16 ENCOUNTER — Emergency Department: Payer: Self-pay

## 2016-08-16 ENCOUNTER — Encounter: Payer: Self-pay | Admitting: *Deleted

## 2016-08-16 ENCOUNTER — Observation Stay
Admission: EM | Admit: 2016-08-16 | Discharge: 2016-08-17 | Disposition: A | Discharge status: Home / Self Care | Payer: Self-pay | Attending: Internal Medicine | Admitting: Internal Medicine

## 2016-08-16 DIAGNOSIS — R519 Headache, unspecified: Secondary | ICD-10-CM

## 2016-08-16 DIAGNOSIS — M797 Fibromyalgia: Secondary | ICD-10-CM | POA: Insufficient documentation

## 2016-08-16 DIAGNOSIS — G459 Transient cerebral ischemic attack, unspecified: Secondary | ICD-10-CM

## 2016-08-16 DIAGNOSIS — R51 Headache: Secondary | ICD-10-CM

## 2016-08-16 DIAGNOSIS — M069 Rheumatoid arthritis, unspecified: Secondary | ICD-10-CM | POA: Insufficient documentation

## 2016-08-16 DIAGNOSIS — F17210 Nicotine dependence, cigarettes, uncomplicated: Secondary | ICD-10-CM | POA: Insufficient documentation

## 2016-08-16 DIAGNOSIS — R200 Anesthesia of skin: Secondary | ICD-10-CM

## 2016-08-16 DIAGNOSIS — Z86711 Personal history of pulmonary embolism: Secondary | ICD-10-CM | POA: Insufficient documentation

## 2016-08-16 DIAGNOSIS — J449 Chronic obstructive pulmonary disease, unspecified: Secondary | ICD-10-CM | POA: Insufficient documentation

## 2016-08-16 DIAGNOSIS — F329 Major depressive disorder, single episode, unspecified: Secondary | ICD-10-CM | POA: Insufficient documentation

## 2016-08-16 DIAGNOSIS — Z79899 Other long term (current) drug therapy: Secondary | ICD-10-CM | POA: Insufficient documentation

## 2016-08-16 DIAGNOSIS — R531 Weakness: Principal | ICD-10-CM

## 2016-08-16 DIAGNOSIS — K219 Gastro-esophageal reflux disease without esophagitis: Secondary | ICD-10-CM | POA: Insufficient documentation

## 2016-08-16 DIAGNOSIS — Z7982 Long term (current) use of aspirin: Secondary | ICD-10-CM | POA: Insufficient documentation

## 2016-08-16 DIAGNOSIS — R29898 Other symptoms and signs involving the musculoskeletal system: Secondary | ICD-10-CM

## 2016-08-16 DIAGNOSIS — M19042 Primary osteoarthritis, left hand: Secondary | ICD-10-CM | POA: Insufficient documentation

## 2016-08-16 DIAGNOSIS — G4730 Sleep apnea, unspecified: Secondary | ICD-10-CM | POA: Insufficient documentation

## 2016-08-16 DIAGNOSIS — Z888 Allergy status to other drugs, medicaments and biological substances status: Secondary | ICD-10-CM | POA: Insufficient documentation

## 2016-08-16 DIAGNOSIS — F419 Anxiety disorder, unspecified: Secondary | ICD-10-CM | POA: Insufficient documentation

## 2016-08-16 HISTORY — DX: Chronic obstructive pulmonary disease, unspecified: J44.9

## 2016-08-16 HISTORY — DX: Sleep apnea, unspecified: G47.30

## 2016-08-16 LAB — COMPREHENSIVE METABOLIC PANEL
ALBUMIN: 3.7 g/dL (ref 3.5–5.0)
ALT: 22 U/L (ref 14–54)
AST: 21 U/L (ref 15–41)
Alkaline Phosphatase: 67 U/L (ref 38–126)
Anion gap: 6 (ref 5–15)
BUN: 19 mg/dL (ref 6–20)
CO2: 29 mmol/L (ref 22–32)
Calcium: 9 mg/dL (ref 8.9–10.3)
Chloride: 105 mmol/L (ref 101–111)
Creatinine, Ser: 1.09 mg/dL — ABNORMAL HIGH (ref 0.44–1.00)
GFR calc non Af Amer: 59 mL/min — ABNORMAL LOW (ref >60)
GLUCOSE: 122 mg/dL — AB (ref 65–99)
Potassium: 4.9 mmol/L (ref 3.5–5.1)
SODIUM: 140 mmol/L (ref 135–145)
Total Bilirubin: 0.5 mg/dL (ref 0.3–1.2)
Total Protein: 6.4 g/dL — ABNORMAL LOW (ref 6.5–8.1)

## 2016-08-16 LAB — DIFFERENTIAL
BASOS ABS: 0 10*3/uL (ref 0–0.1)
BASOS PCT: 1 %
Eosinophils Absolute: 0.1 10*3/uL (ref 0–0.7)
Eosinophils Relative: 2 %
Lymphocytes Relative: 30 %
Lymphs Abs: 2.2 10*3/uL (ref 1.0–3.6)
MONO ABS: 0.4 10*3/uL (ref 0.2–0.9)
Monocytes Relative: 6 %
NEUTROS ABS: 4.4 10*3/uL (ref 1.4–6.5)
NEUTROS PCT: 61 %

## 2016-08-16 LAB — APTT: APTT: 24 s (ref 24–36)

## 2016-08-16 LAB — CBC
HCT: 45.5 % (ref 35.0–47.0)
Hemoglobin: 15.7 g/dL (ref 12.0–16.0)
MCH: 30.6 pg (ref 26.0–34.0)
MCHC: 34.6 g/dL (ref 32.0–36.0)
MCV: 88.4 fL (ref 80.0–100.0)
PLATELETS: 249 10*3/uL (ref 150–440)
RBC: 5.15 MIL/uL (ref 3.80–5.20)
RDW: 14.4 % (ref 11.5–14.5)
WBC: 7.2 10*3/uL (ref 3.6–11.0)

## 2016-08-16 LAB — PROTIME-INR
INR: 0.91
PROTHROMBIN TIME: 12.2 s (ref 11.4–15.2)

## 2016-08-16 MED ORDER — ASPIRIN 81 MG PO CHEW
324.0000 mg | CHEWABLE_TABLET | Freq: Once | ORAL | Status: AC
  Administered 2016-08-16: 324 mg via ORAL
  Filled 2016-08-16: qty 4

## 2016-08-16 MED ORDER — DULOXETINE HCL 30 MG PO CPEP
60.0000 mg | ORAL_CAPSULE | Freq: Every day | ORAL | Status: DC
  Filled 2016-08-16: qty 2

## 2016-08-16 MED ORDER — PANTOPRAZOLE SODIUM 40 MG PO TBEC
40.0000 mg | DELAYED_RELEASE_TABLET | Freq: Every day | ORAL | Status: DC
  Administered 2016-08-17: 09:00:00 40 mg via ORAL
  Filled 2016-08-16: qty 1

## 2016-08-16 MED ORDER — ASPIRIN EC 81 MG PO TBEC
81.0000 mg | DELAYED_RELEASE_TABLET | Freq: Every day | ORAL | Status: DC
  Administered 2016-08-17: 81 mg via ORAL
  Filled 2016-08-16: qty 1

## 2016-08-16 MED ORDER — BUPROPION HCL ER (XL) 300 MG PO TB24
300.0000 mg | ORAL_TABLET | Freq: Every day | ORAL | Status: DC
  Administered 2016-08-17: 09:00:00 300 mg via ORAL
  Filled 2016-08-16: qty 1

## 2016-08-16 MED ORDER — ACETAMINOPHEN 325 MG PO TABS: 650.0000 mg | ORAL_TABLET | ORAL | Status: DC | PRN

## 2016-08-16 MED ORDER — STROKE: EARLY STAGES OF RECOVERY BOOK
Freq: Once | Status: AC
  Administered 2016-08-16

## 2016-08-16 MED ORDER — GABAPENTIN 300 MG PO CAPS: 300.0000 mg | ORAL_CAPSULE | Freq: Every day | ORAL | Status: DC

## 2016-08-16 MED ORDER — ACETAMINOPHEN 325 MG PO TABS
650.0000 mg | ORAL_TABLET | Freq: Once | ORAL | Status: AC
  Administered 2016-08-16: 650 mg via ORAL
  Filled 2016-08-16: qty 2

## 2016-08-16 MED ORDER — ACETAMINOPHEN 160 MG/5ML PO SOLN: 650.0000 mg | ORAL | Status: DC | PRN

## 2016-08-16 MED ORDER — INSULIN ASPART 100 UNIT/ML ~~LOC~~ SOLN: 0.0000 [IU] | Freq: Three times a day (TID) | SUBCUTANEOUS | Status: DC

## 2016-08-16 MED ORDER — VENLAFAXINE HCL ER 75 MG PO CP24
300.0000 mg | ORAL_CAPSULE | Freq: Every day | ORAL | Status: DC
  Administered 2016-08-17: 300 mg via ORAL
  Filled 2016-08-16: qty 4

## 2016-08-16 MED ORDER — SODIUM CHLORIDE 0.9 % IV SOLN
INTRAVENOUS | Status: DC
  Administered 2016-08-17: via INTRAVENOUS

## 2016-08-16 MED ORDER — ENOXAPARIN SODIUM 40 MG/0.4ML ~~LOC~~ SOLN
40.0000 mg | SUBCUTANEOUS | Status: DC
  Administered 2016-08-17: 40 mg via SUBCUTANEOUS
  Filled 2016-08-16: qty 0.4

## 2016-08-16 MED ORDER — INSULIN ASPART 100 UNIT/ML ~~LOC~~ SOLN: 0.0000 [IU] | Freq: Every day | SUBCUTANEOUS | Status: DC

## 2016-08-16 MED ORDER — SENNOSIDES-DOCUSATE SODIUM 8.6-50 MG PO TABS: 1.0000 | ORAL_TABLET | Freq: Every evening | ORAL | Status: DC | PRN

## 2016-08-16 MED ORDER — ACETAMINOPHEN 650 MG RE SUPP: 650.0000 mg | RECTAL | Status: DC | PRN

## 2016-08-16 NOTE — ED Provider Notes (Signed)
[facility] Emergency Department Provider Note  ____________________________________________  Time seen: Approximately 8:30 PM  I have reviewed the triage vital signs and the nursing notes.   HISTORY  Chief Complaint Numbness    HPI MITCHELLE NEWLON is a 46 y.o. 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 The patient reports that she has been having a mild headache for the past 2 days. The patient reports that at 1:30 this afternoon,she was walking when she noted that her left lower extremity was numb and tingly. She decided to lay down for nap and when she awoke, she was unable to use her left upper extremity due to weakness, and was having difficulty walking due to the numbness and tingling in the left leg. She denies any visual changes, speech changes, mental status changes, recent trauma, or other recent illness. At this time, she reports that her symptoms are slightly improved.   Past Medical History:  Diagnosis Date  . Fibromyalgia   . H/O diabetes mellitus    resolved with weight loss  . Osteoarthritis   . PE (pulmonary embolism) 2001  . Rheumatoid arthritis (HCC)   . Sleep apnea     Patient Active Problem List   Diagnosis Date Noted  . Chest pain 10/14/2014  . Dyspnea on exertion 10/14/2014  . Fibromyalgia 10/14/2014  . Rheumatoid arthritis (HCC) 10/14/2014  . DOE (dyspnea on exertion) 10/14/2014    Past Surgical History:  Procedure Laterality Date  . ABDOMINAL HYSTERECTOMY    . bunions, bilater    . CARDIAC CATHETERIZATION N/A 10/20/2014   Procedure: Left Heart Cath;  Surgeon: Laurier Nancy, MD;  Location: [facility] INVASIVE CV LAB;  Service: Cardiovascular;  Laterality: N/A;    Current Outpatient Rx  . Order #: [id number] Class: Historical Med  . Order #: [id number] Class: Historical Med  . Order #:  [id number] Class: Historical Med  . Order #: [id number] Class: Historical Med  . Order #: [id number] Class: Historical Med    Allergies Azithromycin  Family History  Problem Relation Age of Onset  . Rheum arthritis Mother   . Skin cancer Mother   . Osteoarthritis Mother   . Hypertension Mother   . Colon polyps Mother   . Liver cancer Father   . Rheum arthritis Maternal Grandmother   . Alcohol abuse Maternal Grandmother   . Osteoarthritis Maternal Grandmother   . Stroke Maternal Grandmother   . Obesity Maternal Grandmother   . CAD Maternal Grandmother     Social History Social History  Substance Use Topics  . Smoking status: Current Every Day Smoker    Packs/day: 0.50    Years: 30.00    Types: Cigarettes  . Smokeless tobacco: Not on file  . Alcohol use 0.6 oz/week    1 Glasses of wine per week     Comment: 1-2 glasses wine, occassional    Review of Systems Constitutional: No fever/chills.No lightheadedness or syncope. No trauma. Eyes: No visual changes. No blurred or double vision. ENT: No sore throat. No congestion or rhinorrhea. Cardiovascular: Denies chest pain. Denies palpitations. Respiratory: Denies shortness of breath.  No cough. Gastrointestinal: No abdominal pain.  No nausea, no vomiting.  No diarrhea.  No constipation. Genitourinary: Negative for dysuria. Musculoskeletal: Negative for back pain. No neck pain. Skin: Negative for rash. Neurological: Positive for headaches. Positive left upper extremity and left lower extremity numbness and tingling. Positive left upper extremity  weakness.   10-point ROS otherwise negative.  ____________________________________________   PHYSICAL EXAM:  VITAL SIGNS: ED Triage Vitals  Enc Vitals Group     BP 08/16/16 1948 (!) 175/125     Pulse Rate 08/16/16 1948 92     Resp 08/16/16 1948 18     Temp 08/16/16 1948 98.8 F (37.1 C)     Temp Source 08/16/16 1948 Oral     SpO2 08/16/16 1948 98 %     Weight 08/16/16 1949  (!) 317 lb (143.8 kg)     Height 08/16/16 1949 5\' 7"  (1.702 m)     Head Circumference --      Peak Flow --      Pain Score 08/16/16 1948 8     Pain Loc --      Pain Edu? --      Excl. in GC? --     Constitutional: Alert and oriented. Nontoxic. Answers questions appropriately. Eyes: Conjunctivae are normal.  EOMI. PERRLA. No scleral icterus. Head: Atraumatic. Nose: No congestion/rhinnorhea. Mouth/Throat: Mucous membranes are moist.  Neck: No stridor.  Supple.  No JVD. Cardiovascular: Normal rate, regular rhythm. No murmurs, rubs or gallops.  Respiratory: Normal respiratory effort.  No accessory muscle use or retractions. Lungs CTAB.  No wheezes, rales or ronchi. Gastrointestinal: Morbidly obese. Soft, nontender and nondistended.  No guarding or rebound.  No peritoneal signs. Musculoskeletal: No LE edema. No ttp in the calves or palpable cords.  Negative Homan's sign. Neurologic: Alert and oriented 3. Speech is clear. Naming and repetition are intact. Face and smile symmetric. Tongue is midline. Patient initially is unable to lift her left arm for pronator drift testing, but with time, she has full elevation of the arm but is unable to keep it in position. Intermittently, the patient is able to give me 5 out of 5 strength in the left hand. She has 5 out of 5 strength in the right grip, bilateral biceps and right triceps. She has 4 out of 5 left triceps grip. She has 5 out of 5 bilateral hip strength, dorsiflexion and plantar flexion. She has a crease sensation to light touch in the left upper extremity and left lower extremity but normal sensation in the face. She has normal heel-to-shin testing without ataxia bilaterally.  Skin:  Skin is warm, dry and intact. No rash noted. Psychiatric: Mood and affect are normal. Speech and behavior are normal.  Normal judgement.  ____________________________________________   LABS (all labs ordered are listed, but only abnormal results are  displayed)  Labs Reviewed  COMPREHENSIVE METABOLIC PANEL - Abnormal; Notable for the following:       Result Value   Glucose, Bld 122 (*)    Creatinine, Ser 1.09 (*)    Total Protein 6.4 (*)    GFR calc non Af Amer 59 (*)    All other components within normal limits  PROTIME-INR  APTT  CBC  DIFFERENTIAL  URINALYSIS, ROUTINE W REFLEX MICROSCOPIC   ____________________________________________  EKG  ED ECG REPORT I, 08/18/16, the attending physician, personally viewed and interpreted this ECG.   Date: 08/16/2016  EKG Time: 1956  Rate: 94  Rhythm: normal sinus rhythm  Axis: leftward  Intervals:none  ST&T Change: Nonspecific T-wave inversion in V1. No ST elevation.  ____________________________________________  RADIOLOGY  Ct Head Wo Contrast  Result Date: 08/16/2016 CLINICAL DATA:  Left arm weakness beginning at 1 o'clock today. EXAM: CT HEAD WITHOUT CONTRAST TECHNIQUE: Contiguous axial images were obtained from the base of  the skull through the vertex without intravenous contrast. COMPARISON:  None. FINDINGS: Brain: No acute infarct, hemorrhage, or mass lesion is present. The ventricles are of normal size. No significant extraaxial fluid collection is present. The brainstem and cerebellum are normal. Vascular: No hyperdense vessel or unexpected calcification. Skull: The calvarium is intact. No focal lytic or blastic lesions are present. Sinuses/Orbits: The paranasal sinuses and left mastoid air cells are clear. A wall down left mastoidectomy is noted. The globes and orbits are within normal limits bilaterally. Other: A right frontal hyperdense scalp lesion measures 11 mm, likely a sebaceous cyst. IMPRESSION: 1. Normal CT appearance of the brain. 2. Right mastoidectomy. Electronically Signed   By: Marin Roberts M.D.   On: 08/16/2016 20:18    ____________________________________________   PROCEDURES  Procedure(s) performed: None  Procedures  Critical  Care performed: No ____________________________________________   INITIAL IMPRESSION / ASSESSMENT AND PLAN / ED COURSE  Pertinent labs & imaging results that were available during my care of the patient were reviewed by me and considered in my medical decision making (see chart for details).  46 y.o. female with 2 days of headache, left upper and lower extremity numbness and tingling, and left arm weakness. Overall, the patient is nontoxic, and her blood pressure is elevated however she is mentating normally. Her examination is inconsistent when I repeated from parts of the tests, especially the motor strength testing in the left upper extremity. Her CT scan does not show any acute process. Given the onset of her symptoms, and the decreasing severity, the patient is not a candidate for TPA. I have ordered an MRI as well as MRA of the head and neck, and neurology will be consult did for final disposition.  ----------------------------------------- 8:59 PM on 08/16/2016 -----------------------------------------  The patient has been seen and evaluated by the telemetry neurologist on-call who agrees that her symptoms are inconsistent with a stroke, however she does have risk factors and warrants further evaluation including MRI of the brain and MRA of the neck. Plan admission at this time  ____________________________________________  FINAL CLINICAL IMPRESSION(S) / ED DIAGNOSES  Final diagnoses:  Left arm weakness  Acute nonintractable headache, unspecified headache type  Left sided numbness         NEW MEDICATIONS STARTED DURING THIS VISIT:  New Prescriptions   No medications on file      Rockne Menghini, MD 08/16/16 2059

## 2016-08-16 NOTE — ED Notes (Signed)
Pt reports headache x2 days, today pt began to have tingling in left leg @ 1300, laid down took nap, woke at 1700 and found that left arm was non functioning.

## 2016-08-16 NOTE — H&P (Signed)
[facility] -  @ [facility] Admission History and Physical [employer], D.O.  ---------------------------------------------------------------------------------------------------------------------   PATIENT NAME: Dawn Roberson MR#: [id number] DATE OF BIRTH: 1970-07-04 DATE OF ADMISSION: 08/16/2016 PRIMARY CARE PHYSICIAN: No primary care provider on file.  REQUESTING/REFERRING PHYSICIAN: ED Dr. Sharma Covert  CHIEF COMPLAINT: Chief Complaint  Patient presents with  . Numbness    HISTORY OF PRESENT ILLNESS: Dawn Roberson is a 46 y.o. female with a known history of fibromyalgia, OA, PE, RA, HTN not medicated, major depression in the past was in a usual state of health until this afternoon when she was donating blood she developed left anterior thigh numbness.  Symptoms Progressed to include left upper extremity weakness while she was driving home. She also reported numbness and tingling of the left hand and arm. She laid down to take a nap and when she woke up she stated that she could not use the entire left side of her body and had numbness and tingling of both left upper extremity and left lower extremity. She was able to walk to the bathroom was not able to lift her arm. She denies any history of cervical or lumbar radiculopathy or known degenerative disc disease She denies any mental status change, speech difficulty, vision changes. She also reports a history of 2 days of headache. She states that her symptoms persist.  Of note she states that she had a DVT and PE many years ago for which she was successfully anticoagulated and has not required any continued anticoagulation. Otherwise there has been no change in status. Patient has been taking medication as prescribed and there has been no recent change in medication or diet.  There has been no recent illness, travel or sick contacts.    Patient denies fevers/chills, dizziness, chest pain, shortness of breath, N/V/C/D, abdominal pain,  dysuria/frequency, changes in mental status.   EMS/ED COURSE:  Patient received aspiring 324mg  and Tylenol 650mg . patient was seen by telemetry neurologist recommended admission for stroke workup    PAST MEDICAL HISTORY: Past Medical History:  Diagnosis Date  . Fibromyalgia   . H/O diabetes mellitus    resolved with weight loss  . Osteoarthritis   . PE (pulmonary embolism) 2001  . Rheumatoid arthritis (HCC)   . Sleep apnea       PAST SURGICAL HISTORY: Past Surgical History:  Procedure Laterality Date  . ABDOMINAL HYSTERECTOMY    . bunions, bilater    . CARDIAC CATHETERIZATION N/A 10/20/2014   Procedure: Left Heart Cath;  Surgeon: Laurier Nancy, MD;  Location: [facility] INVASIVE CV LAB;  Service: Cardiovascular;  Laterality: N/A;  Tympanoplasty, foot surgery, hysterectomy and tonsillectomy    SOCIAL HISTORY: Social History  Substance Use Topics  . Smoking status: Current Every Day Smoker    Packs/day: 0.50    Years: 30.00    Types: Cigarettes  . Smokeless tobacco: Not on file  . Alcohol use 0.6 oz/week    1 Glasses of wine per week     Comment: 1-2 glasses wine, occassional      FAMILY HISTORY: Family History  Problem Relation Age of Onset  . Rheum arthritis Mother   . Skin cancer Mother   . Osteoarthritis Mother   . Hypertension Mother   . Colon polyps Mother   . Liver cancer Father   . Rheum arthritis Maternal Grandmother   . Alcohol abuse Maternal Grandmother   . Osteoarthritis Maternal Grandmother   . Stroke Maternal Grandmother   . Obesity Maternal Grandmother   .  CAD Maternal Grandmother      MEDICATIONS AT HOME: Prior to Admission medications   Medication Sig Start Date End Date Taking? Authorizing Provider  aspirin EC 81 MG tablet Take 81 mg by mouth daily.    Historical Provider, MD  DULoxetine (CYMBALTA) 60 MG capsule Take 60 mg by mouth daily.    Historical Provider, MD  isosorbide mononitrate (IMDUR) 30 MG 24 hr tablet Take 30 mg by mouth daily.     Historical Provider, MD  meloxicam (MOBIC) 15 MG tablet Take 15 mg by mouth daily.    Historical Provider, MD  pantoprazole (PROTONIX) 40 MG tablet Take 40 mg by mouth daily.    Historical Provider, MD      DRUG ALLERGIES: Allergies  Allergen Reactions  . Azithromycin Hives     REVIEW OF SYSTEMS: CONSTITUTIONAL: No fever/chills, fatigue, weakness, weight gain/loss, headache EYES: No blurry or double vision. ENT: No tinnitus, postnasal drip, redness or soreness of the oropharynx. RESPIRATORY: No cough, wheeze, hemoptysis, dyspnea. CARDIOVASCULAR: No chest pain, orthopnea, palpitations, syncope. GASTROINTESTINAL: No nausea, vomiting, constipation, diarrhea, abdominal pain, hematemesis, melena or hematochezia. GENITOURINARY: No dysuria or hematuria. ENDOCRINE: No polyuria or nocturia. No heat or cold intolerance. HEMATOLOGY: No anemia, bruising, bleeding. INTEGUMENTARY: No rashes, ulcers, lesions. MUSCULOSKELETAL: No arthritis, swelling, gout. NEUROLOGIC: Positive numbness, tingling, weakness and ataxia as described in HPI. No seizure-type activity. PSYCHIATRIC: No anxiety, depression, insomnia.  PHYSICAL EXAMINATION: VITAL SIGNS: Blood pressure (!) 175/125, pulse 92, temperature 98.8 F (37.1 C), temperature source Oral, resp. rate 18, height 5\' 7"  (1.702 m), weight (!) 143.8 kg (317 lb), SpO2 98 %.  GENERAL: 46 y.o.-year-old morbidly obese female patient, well-developed, well-nourished lying in the bed in no acute distress.  Pleasant and cooperative.   HEENT: Head atraumatic, normocephalic. Pupils equal, round, reactive to light and accommodation. No scleral icterus. Extraocular muscles intact. Nares are patent. Oropharynx is clear. Mucus membranes moist. NECK: Supple, full range of motion. No JVD, no bruit heard. No thyroid enlargement, no tenderness, no cervical lymphadenopathy. CHEST: Normal breath sounds bilaterally. No wheezing, rales, rhonchi or crackles. No use of  accessory muscles of respiration.  No reproducible chest wall tenderness.  CARDIOVASCULAR: S1, S2 normal. No murmurs, rubs, or gallops. Cap refill <2 seconds. ABDOMEN: Soft, nontender, nondistended. No rebound, guarding, rigidity. Normoactive bowel sounds present in all four quadrants. No organomegaly or mass. EXTREMITIES: Full range of motion. No pedal edema, cyanosis, or clubbing. NEUROLOGIC: Patient is awake alert and oriented 3. Cranial nerves II through XII are grossly intact with no focal sensorimotor deficit. Muscle strength 5/5 in right upper, right lower and left lower extremities extremities, 3 out of 5 in the left upper extremity. She lifts her arm however with her shoulder. She is unable to extend her left breast. Sensation intact globally. Cerebellar signs are intact. Speech is clear Gait not checked. PSYCHIATRIC: Normal affect, mood, thought content. SKIN: Warm, dry, and intact without obvious rash, lesion, or ulcer.  LABORATORY PANEL:  CBC  Recent Labs Lab 08/16/16 2014  WBC 7.2  HGB 15.7  HCT 45.5  PLT 249   ----------------------------------------------------------------------------------------------------------------- Chemistries  Recent Labs Lab 08/16/16 2014  NA 140  K 4.9  CL 105  CO2 29  GLUCOSE 122*  BUN 19  CREATININE 1.09*  CALCIUM 9.0  AST 21  ALT 22  ALKPHOS 67  BILITOT 0.5   ------------------------------------------------------------------------------------------------------------------ Cardiac Enzymes No results for input(s): TROPONINI in the last 168 hours. ------------------------------------------------------------------------------------------------------------------  RADIOLOGY: Ct Head Wo Contrast  Result Date:  08/16/2016 CLINICAL DATA:  Left arm weakness beginning at 1 o'clock today. EXAM: CT HEAD WITHOUT CONTRAST TECHNIQUE: Contiguous axial images were obtained from the base of the skull through the vertex without intravenous  contrast. COMPARISON:  None. FINDINGS: Brain: No acute infarct, hemorrhage, or mass lesion is present. The ventricles are of normal size. No significant extraaxial fluid collection is present. The brainstem and cerebellum are normal. Vascular: No hyperdense vessel or unexpected calcification. Skull: The calvarium is intact. No focal lytic or blastic lesions are present. Sinuses/Orbits: The paranasal sinuses and left mastoid air cells are clear. A wall down left mastoidectomy is noted. The globes and orbits are within normal limits bilaterally. Other: A right frontal hyperdense scalp lesion measures 11 mm, likely a sebaceous cyst. IMPRESSION: 1. Normal CT appearance of the brain. 2. Right mastoidectomy. Electronically Signed   By: Marin Roberts M.D.   On: 08/16/2016 20:18    EKG: NSR@94bpm , leftward axis, nonspecific ST-T wave changes  IMPRESSION AND PLAN:  This is a 46 y.o. female with a history of  fibromyalgia, OA, PE, RA, HTN, GERD, major depression, ?POTS,  ?arrythmia, ?left hand paresthesia in the past now being admitted with:  1. Left sided weakness, rule out TIA/CVA -  - Admit telemetry observation for neuro workup including: - Studies: MRA/MRI, Echo, Carotids - Labs: CBC, BMP, Lipids, TFTs, A1C - Nursing: Neurochecks, O2, dysphagia screen, permissive hypertension.  - Consults: Neurology, PT/OT, S/S consults.  - Meds: Daily aspirin 81mg .   - Fluids: IVNS@75cc /hr.   - Routine DVT Px: with Lovenox, SCDs, early ambulation  2. AKI possibly dehydration - IVFs and repeat BMP in AM - Hold Mobic for now  3. H/o Diabetes, listed as resolved with weight loss - Accuchecks achs with RISS coverage - Heart healthy, carb controlled diet if patient passes swallow eval  4. H/O HTN - Permissive hypertension  5. H/O fibromyalgia - Continue gabapentin  6. H/O depression - Continue Wellbutrin, Effexor  7. H/O OA - Pain control - Hold Mobic for now  8. H/O PE, not anticoagulated.   Risk factors for DVT/PE include obesity, prior VTE - Lovenox 40mg  SQ Q24  Admission status: Observation, tele Diet: NPO pending swallow eval then heart healthy, carb controlled IVFs: NS Consults called: Neurology DVT Px: Lovenox, SCDs and early ambulation Code Status: Full Disposition Plan: To home in <24 hours  All the records are reviewed and case discussed with ED provider. Management plans discussed with the patient and/or family who express understanding and agree with plan of care.   TOTAL TIME TAKING CARE OF THIS PATIENT: 60 minutes.   Dawn Roberson D.O. on 08/16/2016 at 9:05 PM Between 7am to 6pm - Pager - [phone] After 6pm go to [email or web address] - 08/18/2016 [place] [facility] Office [phone] CC: Primary care physician; No primary care provider on file.     Note: This dictation was prepared with Dragon dictation along with smaller phrase technology. Any transcriptional errors that result from this process are unintentional.

## 2016-08-16 NOTE — ED Notes (Signed)
Patient transported to CT 

## 2016-08-16 NOTE — ED Triage Notes (Signed)
Patient c/o left arm weakness since 1300 today. Patient c/o tingling on left side of neck and report gait is unsteady.

## 2016-08-17 ENCOUNTER — Observation Stay
Admit: 2016-08-17 | Discharge: 2016-08-17 | Disposition: A | Discharge status: Home / Self Care | Payer: Self-pay | Attending: Family Medicine | Admitting: Family Medicine

## 2016-08-17 ENCOUNTER — Observation Stay: Payer: Self-pay

## 2016-08-17 DIAGNOSIS — R29898 Other symptoms and signs involving the musculoskeletal system: Secondary | ICD-10-CM

## 2016-08-17 LAB — LIPID PANEL
CHOL/HDL RATIO: 3.7 ratio
CHOLESTEROL: 198 mg/dL (ref 0–200)
HDL: 53 mg/dL (ref >40)
LDL Cholesterol: 103 mg/dL — ABNORMAL HIGH (ref 0–99)
Triglycerides: 209 mg/dL — ABNORMAL HIGH (ref <150)
VLDL: 42 mg/dL — ABNORMAL HIGH (ref 0–40)

## 2016-08-17 LAB — URINALYSIS, DIPSTICK ONLY
BILIRUBIN URINE: NEGATIVE
Glucose, UA: NEGATIVE mg/dL
HGB URINE DIPSTICK: NEGATIVE
Ketones, ur: NEGATIVE mg/dL
Leukocytes, UA: NEGATIVE
Nitrite: NEGATIVE
PH: 5 (ref 5.0–8.0)
Protein, ur: NEGATIVE mg/dL
SPECIFIC GRAVITY, URINE: 1.025 (ref 1.005–1.030)

## 2016-08-17 LAB — ECHOCARDIOGRAM COMPLETE
HEIGHTINCHES: 67 in
WEIGHTICAEL: 5072 [oz_av]

## 2016-08-17 LAB — CBC
HCT: 39 % (ref 35.0–47.0)
Hemoglobin: 13.6 g/dL (ref 12.0–16.0)
MCH: 30.8 pg (ref 26.0–34.0)
MCHC: 34.9 g/dL (ref 32.0–36.0)
MCV: 88.3 fL (ref 80.0–100.0)
PLATELETS: 197 10*3/uL (ref 150–440)
RBC: 4.41 MIL/uL (ref 3.80–5.20)
RDW: 14.4 % (ref 11.5–14.5)
WBC: 4.7 10*3/uL (ref 3.6–11.0)

## 2016-08-17 LAB — URINE DRUG SCREEN, QUALITATIVE (ARMC ONLY)
AMPHETAMINES, UR SCREEN: NOT DETECTED
BARBITURATES, UR SCREEN: NOT DETECTED
BENZODIAZEPINE, UR SCRN: NOT DETECTED
Cannabinoid 50 Ng, Ur ~~LOC~~: NOT DETECTED
Cocaine Metabolite,Ur ~~LOC~~: NOT DETECTED
MDMA (Ecstasy)Ur Screen: NOT DETECTED
METHADONE SCREEN, URINE: NOT DETECTED
Opiate, Ur Screen: NOT DETECTED
Phencyclidine (PCP) Ur S: NOT DETECTED
TRICYCLIC, UR SCREEN: NOT DETECTED

## 2016-08-17 LAB — URINALYSIS, ROUTINE W REFLEX MICROSCOPIC
Bilirubin Urine: NEGATIVE
GLUCOSE, UA: NEGATIVE mg/dL
Hgb urine dipstick: NEGATIVE
Ketones, ur: NEGATIVE mg/dL
Leukocytes, UA: NEGATIVE
Nitrite: NEGATIVE
Protein, ur: NEGATIVE mg/dL
Specific Gravity, Urine: 1.025 (ref 1.005–1.030)
pH: 5 (ref 5.0–8.0)

## 2016-08-17 LAB — COMPREHENSIVE METABOLIC PANEL
ALK PHOS: 54 U/L (ref 38–126)
ALT: 18 U/L (ref 14–54)
AST: 17 U/L (ref 15–41)
Albumin: 3 g/dL — ABNORMAL LOW (ref 3.5–5.0)
Anion gap: 5 (ref 5–15)
BUN: 16 mg/dL (ref 6–20)
CALCIUM: 8.6 mg/dL — AB (ref 8.9–10.3)
CHLORIDE: 108 mmol/L (ref 101–111)
CO2: 27 mmol/L (ref 22–32)
CREATININE: 0.84 mg/dL (ref 0.44–1.00)
GFR calc Af Amer: >60 mL/min (ref >60)
GFR calc non Af Amer: >60 mL/min (ref >60)
Glucose, Bld: 119 mg/dL — ABNORMAL HIGH (ref 65–99)
Potassium: 3.7 mmol/L (ref 3.5–5.1)
SODIUM: 140 mmol/L (ref 135–145)
Total Bilirubin: 0.5 mg/dL (ref 0.3–1.2)
Total Protein: 5.2 g/dL — ABNORMAL LOW (ref 6.5–8.1)

## 2016-08-17 LAB — TROPONIN I

## 2016-08-17 LAB — GLUCOSE, CAPILLARY
GLUCOSE-CAPILLARY: 118 mg/dL — AB (ref 65–99)
Glucose-Capillary: 114 mg/dL — ABNORMAL HIGH (ref 65–99)
Glucose-Capillary: 120 mg/dL — ABNORMAL HIGH (ref 65–99)

## 2016-08-17 MED ORDER — ENOXAPARIN SODIUM 40 MG/0.4ML ~~LOC~~ SOLN: 40.0000 mg | Freq: Two times a day (BID) | SUBCUTANEOUS | Status: DC

## 2016-08-17 MED ORDER — PERFLUTREN LIPID MICROSPHERE
1.0000 mL | INTRAVENOUS | Status: AC | PRN
  Administered 2016-08-17: 2 mL via INTRAVENOUS

## 2016-08-17 NOTE — Plan of Care (Signed)
Problem: Education: Goal: Knowledge of disease or condition will improve Outcome: Progressing Pt likes to be called Dawn Roberson  Past Medical History:  Diagnosis Date  . Fibromyalgia   . H/O diabetes mellitus    resolved with weight loss  . Osteoarthritis   . PE (pulmonary embolism) 2001  . Rheumatoid arthritis (HCC)   . Sleep apnea    Pt is well controlled with home medications

## 2016-08-17 NOTE — Progress Notes (Signed)
[facility] - [place] at [facility]   PATIENT NAME: Dawn Roberson    MR#:  [id number]  DATE OF BIRTH:  [birth year]  SUBJECTIVE:  CHIEF COMPLAINT:   Chief Complaint  Patient presents with  . Numbness   - Admitted with left arm and leg numbness and tingling. No weakness noted on exam today. However still complains of left arm feeling heavy and weak. -Scheduled for MRI today. Appreciate neurology consult.  REVIEW OF SYSTEMS:  Review of Systems  Constitutional: Negative for chills, fever and malaise/fatigue.  HENT: Negative for congestion, ear discharge, hearing loss and nosebleeds.   Eyes: Negative for blurred vision and double vision.  Respiratory: Negative for cough, shortness of breath and wheezing.   Cardiovascular: Negative for chest pain, palpitations and leg swelling.  Gastrointestinal: Negative for abdominal pain, constipation, diarrhea, nausea and vomiting.  Genitourinary: Negative for dysuria.  Musculoskeletal: Negative for myalgias.  Neurological: Positive for sensory change and focal weakness. Negative for dizziness, speech change, seizures and headaches.  Psychiatric/Behavioral: Positive for depression.    DRUG ALLERGIES:   Allergies  Allergen Reactions  . Azithromycin Hives    VITALS:  Blood pressure (!) 148/96, pulse 73, temperature 98 F (36.7 C), temperature source Oral, resp. rate 18, height 5\' 7"  (1.702 m), weight (!) 143.8 kg (317 lb), SpO2 99 %.  PHYSICAL EXAMINATION:  Physical Exam  GENERAL:  [name] lying in the bed with no acute distress.  EYES: Pupils equal, round, reactive to light and accommodation. No scleral icterus. Extraocular muscles intact.  HEENT: Head atraumatic, normocephalic. Oropharynx and nasopharynx clear.  NECK:  Supple, no jugular venous distention. No thyroid enlargement, no tenderness.  LUNGS: Normal breath sounds bilaterally, no wheezing, rales,rhonchi or crepitation. No use of  accessory muscles of respiration.  CARDIOVASCULAR: S1, S2 normal. No murmurs, rubs, or gallops.  ABDOMEN: Soft, nontender, nondistended. Bowel sounds present. No organomegaly or mass.  EXTREMITIES: No pedal edema, cyanosis, or clubbing.  NEUROLOGIC: Cranial nerves II through XII are intact. Muscle strength 5/5 in all extremities except left upper extremity as patient feels it is heavy, however when passively raised she is able to hold it in the ER without any support.. Sensation intact. Gait not checked.  PSYCHIATRIC: The patient is alert and oriented x 3.  SKIN: No obvious rash, lesion, or ulcer.    LABORATORY PANEL:   CBC  Recent Labs Lab 08/17/16 0518  WBC 4.7  HGB 13.6  HCT 39.0  PLT 197   ------------------------------------------------------------------------------------------------------------------  Chemistries   Recent Labs Lab 08/17/16 0518  NA 140  K 3.7  CL 108  CO2 27  GLUCOSE 119*  BUN 16  CREATININE 0.84  CALCIUM 8.6*  AST 17  ALT 18  ALKPHOS 54  BILITOT 0.5   ------------------------------------------------------------------------------------------------------------------  Cardiac Enzymes  Recent Labs Lab 08/17/16 0518  TROPONINI <0.03   ------------------------------------------------------------------------------------------------------------------  RADIOLOGY:  Ct Head Wo Contrast  Result Date: 08/16/2016 CLINICAL DATA:  Left arm weakness beginning at 1 o'clock today. EXAM: CT HEAD WITHOUT CONTRAST TECHNIQUE: Contiguous axial images were obtained from the base of the skull through the vertex without intravenous contrast. COMPARISON:  None. FINDINGS: Brain: No acute infarct, hemorrhage, or mass lesion is present. The ventricles are of normal size. No significant extraaxial fluid collection is present. The brainstem and cerebellum are normal. Vascular: No hyperdense vessel or unexpected calcification. Skull: The calvarium is intact. No focal  lytic or blastic lesions are present. Sinuses/Orbits: The paranasal sinuses and left mastoid  air cells are clear. A wall down left mastoidectomy is noted. The globes and orbits are within normal limits bilaterally. Other: A right frontal hyperdense scalp lesion measures 11 mm, likely a sebaceous cyst. IMPRESSION: 1. Normal CT appearance of the brain. 2. Right mastoidectomy. Electronically Signed   By: Marin Roberts M.D.   On: 08/16/2016 20:18    EKG:   Orders placed or performed during the hospital encounter of 08/16/16  . EKG 12-Lead  . EKG 12-Lead  . ED EKG  . ED EKG    ASSESSMENT AND PLAN:   46 year old female with obesity, fibromyalgia, history of DVT and PE off of anticoagulation currently presents to hospital secondary to left upper extremity weakness.  #1 left upper extremity weakness-possible conversion disorder -However patient is under a lot of stress recently. - Death recently of 2 close friends and financial troubles. -Appreciate neurology consult. Will await MRI of the brain, carotid Dopplers and echocardiogram. -If negative, patient can be discharged  #2 depression and anxiety-continue Cymbalta, Wellbutrin and Effexor  #3 GERD-on Protonix  #4 DVT prophylaxis-Lovenox     All the records are reviewed and case discussed with Care Management/Social Workerr. Management plans discussed with the patient, family and they are in agreement.  CODE STATUS: Full code  TOTAL TIME TAKING CARE OF THIS PATIENT: 38 minutes.   POSSIBLE D/C today, DEPENDING ON CLINICAL CONDITION.   Enid Baas M.D on 08/17/2016 at 12:14 PM  Between 7am to 6pm - Pager - [phone]  After 6pm go to [email or web address] - Social research officer, government  [facility] [place] [facility]  Office  [phone]  CC: Primary care physician; Rolm Gala, MD

## 2016-08-17 NOTE — Progress Notes (Signed)
Anticoagulation monitoring(Lovenox):  45yo  female ordered Lovenox 40 mg Q24h for DVT prevention  Filed Weights   08/16/16 1949 08/16/16 2322  Weight: (!) 317 lb (143.8 kg) (!) 317 lb (143.8 kg)   BMI 49.8   Lab Results  Component Value Date   CREATININE 0.84 08/17/2016   CREATININE 1.09 (H) 08/16/2016   CREATININE 0.72 10/15/2014   Estimated Creatinine Clearance: 123.5 mL/min (by C-G formula based on SCr of 0.84 mg/dL). Hemoglobin & Hematocrit     Component Value Date/Time   HGB 13.6 08/17/2016 0518   HCT 39.0 08/17/2016 0518     Per Protocol for Patient with estCrcl > 30 ml/min and BMI > 40, will transition to Lovenox 40 mg Q12h.     Clovia Cuff, PharmD, BCPS 08/17/2016 12:50 PM

## 2016-08-17 NOTE — Progress Notes (Signed)
*  PRELIMINARY RESULTS* Echocardiogram 2D Echocardiogram has been performed.  Cristela Blue 08/17/2016, 11:56 AM

## 2016-08-17 NOTE — Progress Notes (Signed)
While rounding, CH made initial visit to room 106. Pt was awake and alert. Pt indicated that she is gaining strength in her arm. Pt spent some time discussing her support group. Pt stated that she has several friends and family who support her. Pt spoke of her relationship with her father (a former Education officer, environmental) who passed away. She has remorse in that she has not kept a promise she made to him at his death. CH provided empathetic listening and prayer for healing of the the remorse she is experiencing. CH is available for follow up as needed.    08/17/16 1300  Clinical Encounter Type  Visited With Patient  Visit Type Initial;Spiritual support  Consult/Referral To Chaplain  Spiritual Encounters  Spiritual Needs Prayer;Emotional

## 2016-08-17 NOTE — Clinical Social Work Note (Signed)
CSW consulted for discharge planning. Per RNCM, PT is recommending Outpatient PT. RNCM is following for discharge planning needs. CSW is signing off as no further needs identified. Please reconsult if a need arises prior to discharge.   Dede Query, MSW, LCSW  Clinical Social Worker  [phone]

## 2016-08-17 NOTE — Evaluation (Signed)
Physical Therapy Evaluation Patient Details Name: Dawn Roberson MRN: [id number] DOB: 1971/02/11 Today's Date: 08/17/2016   History of Present Illness  45yo female pt presented to ED on 4/25 with L sided weakness with PMHx significant for fibromyalgia, DM, OA, PE (2001), RA, sleep apnea, major depression, and cardiac catheterization (2016.) Heat CT negative.     Clinical Impression  Pt admitted with above diagnosis. Pt currently with functional limitations due to the deficits listed below (see PT Problem List). Dawn Roberson was Ind with ADLs, IADLs, and ambulation PTA without use of an AD. Pt scored a 21/24 on the DGI indicating she is at a low risk of falling.  She additionally has a h/o 2 falls in the past 6 months.  Therefore, recommending OPPT at d/c.  Will continue to follow acutely for interventions targeted on improving balance.    Follow Up Recommendations Outpatient PT    Equipment Recommendations  None recommended by PT    Recommendations for Other Services       Precautions / Restrictions Precautions Precautions: Fall Restrictions Weight Bearing Restrictions: No      Mobility  Bed Mobility Overal bed mobility: Modified Independent             General bed mobility comments: Additional time as she appears to favor using her RUE to assist her with bed mobility.  Transfers Overall transfer level: Independent Equipment used: None             General transfer comment: No instability noted, no physical assist or cues needed.  Ambulation/Gait Ambulation/Gait assistance: Supervision Ambulation Distance (Feet): 230 Feet Assistive device: None Gait Pattern/deviations: Step-through pattern;Wide base of support Gait velocity: decreased Gait velocity interpretation: Below normal speed for age/gender General Gait Details: Pt demonstrates wide base of support and has a few episodes of mild unsteadiness but no LOB.   Stairs Stairs: Yes Stairs assistance: Modified  independent (Device/Increase time) Stair Management: One rail Right;One rail Left;Alternating pattern;Step to pattern;Forwards Number of Stairs: 4 General stair comments: Pt demonstrates alternating pattern holding onto R rail during ascent and step to pattern holding onto L rail during descent  Wheelchair Mobility    Modified Rankin (Stroke Patients Only) Modified Rankin (Stroke Patients Only) Pre-Morbid Rankin Score: No symptoms Modified Rankin: No significant disability     Balance Overall balance assessment: History of Falls;Modified Independent Sitting-balance support: No upper extremity supported;Feet supported Sitting balance-Leahy Scale: Normal     Standing balance support: No upper extremity supported;During functional activity Standing balance-Leahy Scale: Good Standing balance comment: Mild instability noted when amublating in hallway but no LOB                 Standardized Balance Assessment Standardized Balance Assessment : Dynamic Gait Index   Dynamic Gait Index Level Surface: Mild Impairment Change in Gait Speed: Normal Gait with Horizontal Head Turns: Normal Gait with Vertical Head Turns: Normal Gait and Pivot Turn: Normal Step Over Obstacle: Normal Step Around Obstacles: Normal Steps: Moderate Impairment Total Score: 21       Pertinent Vitals/Pain Pain Assessment: No/denies pain    Home Living Family/patient expects to be discharged to:: Private residence Living Arrangements: Alone Available Help at Discharge: Other (Comment) (pt is working on arranging someone to be w/ her at d/c) Type of Home: Mobile home Home Access: Stairs to enter Entrance Stairs-Rails: Lawyer of Steps: 4 Home Layout: One level Home Equipment: Hand held shower head;Adaptive equipment Additional Comments: Pt has family members that live on  her same street    Prior Function Level of Independence: Independent         Comments: Pt  ambulates without AD and reports 2 falls in the past 6 months due to tripping over dogs or "just losing my balance".  Ind with ADLs, IADLs.  Waiting on disability to be approved.     Hand Dominance   Dominant Hand: Left    Extremity/Trunk Assessment   Upper Extremity Assessment Upper Extremity Assessment: Defer to OT evaluation    Lower Extremity Assessment Lower Extremity Assessment: Overall WFL for tasks assessed    Cervical / Trunk Assessment Cervical / Trunk Assessment: Normal  Communication   Communication: No difficulties  Cognition Arousal/Alertness: Awake/alert Behavior During Therapy: WFL for tasks assessed/performed Overall Cognitive Status: Within Functional Limits for tasks assessed                                        General Comments General comments (skin integrity, edema, etc.): Pt scored a 21/24 on the DGI indicating she is at a low risk of falling.  She additionally has a h/o 2 falls in the past 6 months.  Therefore, recommending OPPT at d/c.     Exercises     Assessment/Plan    PT Assessment Patient needs continued PT services  PT Problem List Decreased balance;Decreased safety awareness       PT Treatment Interventions Gait training;Stair training;Therapeutic exercise;Therapeutic activities;Balance training;Functional mobility training;Neuromuscular re-education;Patient/family education    PT Goals (Current goals can be found in the Care Plan section)  Acute Rehab PT Goals Patient Stated Goal: to improve her balance PT Goal Formulation: With patient Time For Goal Achievement: 08/31/16 Potential to Achieve Goals: Good    Frequency 7X/week   Barriers to discharge        Co-evaluation               End of Session Equipment Utilized During Treatment: Gait belt Activity Tolerance: Patient tolerated treatment well Patient left: in bed;with call bell/phone within reach Nurse Communication: Mobility status PT Visit  Diagnosis: History of falling (Z91.81);Unsteadiness on feet (R26.81)    Time: 2637-8588 PT Time Calculation (min) (ACUTE ONLY): 22 min   Charges:   PT Evaluation $PT Eval Low Complexity: 1 Procedure     PT G Codes:   PT G-Codes **NOT FOR INPATIENT CLASS** Functional Assessment Tool Used: Clinical judgement;AM-PAC 6 Clicks Basic Mobility;Dynamic Gait Index Functional Limitation: Mobility: Walking and moving around Mobility: Walking and Moving Around Current Status [phone]): At least 20 percent but less than 40 percent impaired, limited or restricted Mobility: Walking and Moving Around Goal Status [phone]): 0 percent impaired, limited or restricted     Encarnacion Chu PT, DPT 08/17/2016, 4:18 PM

## 2016-08-17 NOTE — Progress Notes (Signed)
Pt for dischsrge home. A/o. No resp distress. Left arm improved in its use.  p.t and o.t. Saw pt.   meds discussed  With pt. Diet / activity and f/u discussed.  Sl d/cd/  Pt verbalized understanding of all discharge plans. Home at this time via w/c w/o c/o.

## 2016-08-17 NOTE — Evaluation (Signed)
Occupational Therapy Evaluation Patient Details Name: Dawn Roberson MRN: [id number] DOB: 1970/07/29 Today's Date: 08/17/2016    History of Present Illness 46yo female pt presented to ED on 4/25 with L sided weakness with PMHx significant for fibromyalgia, DM, OA, PE (2001), RA, sleep apnea, major depression, and cardiac catheterization (2016.) Heat CT negative. MRI, carotid US, neuro c/s, and echo pending.   Clinical Impression   Pt seen for OT evaluation this date. Pt was independent at baseline, however limited by chronic fatigue and pain/stiffness from fibromyalgia. Pt presents with LUE weakness, decreased activity tolerance, decreased knowledge of AE/DME/strategies to support functional independence with self care tasks and will benefit from skilled OT services to address noted impairments and functional deficits including education/training in AE for self care, energy conservation strategies, and home/routines modifications to support safety and independence in the home. Recommend OPOT to address OT goals upon discharge.    Follow Up Recommendations  Outpatient OT    Equipment Recommendations  Tub/shower seat;Other (comment) (toileting aid)    Recommendations for Other Services       Precautions / Restrictions Precautions Precautions: Fall Restrictions Weight Bearing Restrictions: No      Mobility Bed Mobility Overal bed mobility: Modified Independent             General bed mobility comments: additional time, good safe movements  Transfers Overall transfer level: Modified independent Equipment used: None             General transfer comment: good safety awareness, no LOB, good confidence     Balance Overall balance assessment: History of Falls;Modified Independent (no LOB, good confidence)                                         ADL either performed or assessed with clinical judgement   ADL Overall ADL's : Needs  assistance/impaired Eating/Feeding: Modified independent Eating/Feeding Details (indicate cue type and reason): using non-dominant hand, was able to perform however difficult, taking additional time and with minimal spillage Grooming: Wash/dry hands;Standing;Supervision/safety Grooming Details (indicate cue type and reason): using R hand > L hand during bilateral task Upper Body Bathing: Minimal assistance;Sitting   Lower Body Bathing: Minimal assistance;Sit to/from stand;Cueing for compensatory techniques   Upper Body Dressing : Set up;Supervision/safety;Sitting   Lower Body Dressing: Min guard;Cueing for compensatory techniques;Sit to/from stand Lower Body Dressing Details (indicate cue type and reason): using 1 handed non dominant hand technique and additional time to perform Toilet Transfer: Supervision/safety;Ambulation;Regular Toilet   Toileting- Architect and Hygiene: Supervision/safety;Sit to/from stand Toileting - Clothing Manipulation Details (indicate cue type and reason): attempted to use L hand, decreased coordination, but able to perform perineal care with no LOB when in standing; pt educated in toileting aids to assist with perianal hygiene as pt reports this was becoming more difficult at baseline     Functional mobility during ADLs: Supervision/safety (close supervision) General ADL Comments: generally min assist for LB ADL, she is L hand dominant which makes self care tasks much more challenging since that is her weak arm     Vision   Eye Alignment: Within Functional Limits Additional Comments: WFL, no impairments noted during assessment     Perception     Praxis      Pertinent Vitals/Pain Pain Assessment: No/denies pain     Hand Dominance Left   Extremity/Trunk Assessment Upper Extremity Assessment Upper Extremity Assessment:  LUE deficits/detail (RUE grossly 4/5, ROM WFL) LUE Deficits / Details: intact sensation, ataxia, grossly 3+/5, ROM  WFL LUE Coordination: decreased gross motor;decreased fine motor   Lower Extremity Assessment Lower Extremity Assessment: Defer to PT evaluation;Overall [facility] for tasks assessed   Cervical / Trunk Assessment Cervical / Trunk Assessment: Normal   Communication Communication Communication: No difficulties   Cognition Arousal/Alertness: Awake/alert Behavior During Therapy: WFL for tasks assessed/performed Overall Cognitive Status: Within Functional Limits for tasks assessed                                     General Comments       Exercises Other Exercises Other Exercises: pt educated in AE for self care tasks to support chronic fatigue and stiffness Other Exercises: pt provided with handout for energy conservation strategies to support chronic fatigue and falls prevention   Shoulder Instructions      Home Living Family/patient expects to be discharged to:: Private residence Living Arrangements: Alone Available Help at Discharge:  (pt reports family/friends no dependable for help) Type of Home: Mobile home Home Access: Stairs to enter Entrance Stairs-Number of Steps: 4 Entrance Stairs-Rails: Left;Right Home Layout: One level     Bathroom Shower/Tub: Walk-in shower;Tub only;Door (garden tub and a walk in shower)   Firefighter: Standard Bathroom Accessibility: Yes How Accessible: Accessible via wheelchair;Accessible via walker Home Equipment: Hand held shower head;Adaptive equipment Adaptive Equipment: Long-handled sponge        Prior Functioning/Environment Level of Independence: Independent        Comments: pt indep with ADL, IADL including driving, working in her home "to get by" while waiting for disability to be approved; pt indep however, becomes very fatigued quickly, increasingly difficult to perform perineal care due to body habitus and pain/stiffness; endoreses 2 falls in past 12 months "just lost my balance"        OT Problem List:  Decreased coordination;Decreased strength;Decreased range of motion;Impaired UE functional use;Decreased knowledge of use of DME or AE;Decreased activity tolerance      OT Treatment/Interventions: Self-care/ADL training;Energy conservation;DME and/or AE instruction;Patient/family education    OT Goals(Current goals can be found in the care plan section) Acute Rehab OT Goals Patient Stated Goal: get better OT Goal Formulation: With patient Time For Goal Achievement: 08/31/16 Potential to Achieve Goals: Good  OT Frequency: Min 1X/week   Barriers to D/C:            Co-evaluation              End of Session    Activity Tolerance: Patient tolerated treatment well Patient left: in bed;with call bell/phone within reach;with bed alarm set  OT Visit Diagnosis: Other abnormalities of gait and mobility (R26.89);History of falling (Z91.81);Hemiplegia and hemiparesis Hemiplegia - Right/Left: Left Hemiplegia - dominant/non-dominant: Dominant Hemiplegia - caused by: Unspecified                Time: 9833-8250 OT Time Calculation (min): 38 min Charges:  OT General Charges $OT Visit: 1 Procedure OT Evaluation $OT Eval Low Complexity: 1 Procedure OT Treatments $Self Care/Home Management : 23-37 mins G-Codes: OT G-codes **NOT FOR INPATIENT CLASS** Functional Assessment Tool Used: Clinical judgement;AM-PAC 6 Clicks Daily Activity Functional Limitation: Self care Self Care Current Status (N3976): At least 40 percent but less than 60 percent impaired, limited or restricted Self Care Goal Status (B3419): At least 20 percent but less than 40 percent  impaired, limited or restricted   Richrd Prime, MPH, MS, OTR/L ascom [phone] 08/17/16, 10:52 AM

## 2016-08-17 NOTE — Progress Notes (Signed)
SLP Cancellation Note  Patient Details Name: Dawn Roberson MRN: [id number] DOB: [birth year]   Cancelled treatment:       Reason Eval/Treat Not Completed: SLP screened, no needs identified, will sign off (chart reviewed; consulted NSG then pt). Pt denied any difficulty swallowing and is currently on a regular diet; tolerates swallowing pills w/ water per NSG. Pt conversed at conversational level w/out deficits noted; pt denied any speech-language deficits.  No further skilled ST services indicated as pt appears at her baseline. Pt agreed. NSG to reconsult if any change in status.    Jerilynn Som, MS, CCC-SLP Marshawn Normoyle 08/17/2016, 2:14 PM

## 2016-08-17 NOTE — Consult Note (Signed)
Reason for Consult: L sided numbness and weakness  Referring Physician: Dr. Nemiah Commander   CC: L sided numbness and weakness   HPI: Dawn Roberson is an 46 y.o. female with a known history of fibromyalgia, OA, PE, RA, HTN not medicated, major depression in the past was in a usual state of health until yesterday afternoon when she was donating blood she developed left anterior thigh numbness which progressed to her L arm.  Pt states her L arm felt a lot heavier.  Overnight symptoms have improved and close to baseline  Past Medical History:  Diagnosis Date  . COPD (chronic obstructive pulmonary disease) (HCC)   . Fibromyalgia   . H/O diabetes mellitus    resolved with weight loss  . Osteoarthritis   . PE (pulmonary embolism) 2001  . Rheumatoid arthritis (HCC)   . Sleep apnea     Past Surgical History:  Procedure Laterality Date  . ABDOMINAL HYSTERECTOMY    . bunions, bilater    . CARDIAC CATHETERIZATION N/A 10/20/2014   Procedure: Left Heart Cath;  Surgeon: Laurier Nancy, MD;  Location: [facility] INVASIVE CV LAB;  Service: Cardiovascular;  Laterality: N/A;    Family History  Problem Relation Age of Onset  . Rheum arthritis Mother   . Skin cancer Mother   . Osteoarthritis Mother   . Hypertension Mother   . Colon polyps Mother   . Liver cancer Father   . Rheum arthritis Maternal Grandmother   . Alcohol abuse Maternal Grandmother   . Osteoarthritis Maternal Grandmother   . Stroke Maternal Grandmother   . Obesity Maternal Grandmother   . CAD Maternal Grandmother     Social History:  reports that she has been smoking Cigarettes.  She has a 15.00 pack-year smoking history. She has never used smokeless tobacco. She reports that she drinks about 0.6 oz of alcohol per week . She reports that she does not use drugs.  Allergies  Allergen Reactions  . Azithromycin Hives    Medications: I have reviewed the patient's current medications.  ROS: History obtained from the  patient  General ROS: negative for - chills, fatigue, fever, night sweats, weight gain or weight loss Psychological ROS: negative for - behavioral disorder, hallucinations, memory difficulties, mood swings or suicidal ideation Ophthalmic ROS: negative for - blurry vision, double vision, eye pain or loss of vision ENT ROS: negative for - epistaxis, nasal discharge, oral lesions, sore throat, tinnitus or vertigo Allergy and Immunology ROS: negative for - hives or itchy/watery eyes Hematological and Lymphatic ROS: negative for - bleeding problems, bruising or swollen lymph nodes Endocrine ROS: negative for - galactorrhea, hair pattern changes, polydipsia/polyuria or temperature intolerance Respiratory ROS: negative for - cough, hemoptysis, shortness of breath or wheezing Cardiovascular ROS: negative for - chest pain, dyspnea on exertion, edema or irregular heartbeat Gastrointestinal ROS: negative for - abdominal pain, diarrhea, hematemesis, nausea/vomiting or stool incontinence Genito-Urinary ROS: negative for - dysuria, hematuria, incontinence or urinary frequency/urgency Musculoskeletal ROS: negative for - joint swelling or muscular weakness Neurological ROS: as noted in HPI Dermatological ROS: negative for rash and skin lesion changes  Physical Examination: Blood pressure (!) 148/96, pulse 73, temperature 98 F (36.7 C), temperature source Oral, resp. rate 18, height 5\' 7"  (1.702 m), weight (!) 143.8 kg (317 lb), SpO2 99 %.  Neurological Examination   Mental Status: Alert, oriented, thought content appropriate.  Speech fluent without evidence of aphasia.  Able to follow 3 step commands without difficulty. Cranial Nerves: II: Discs flat  bilaterally; Visual fields grossly normal, pupils equal, round, reactive to light and accommodation III,IV, VI: ptosis not present, extra-ocular motions intact bilaterally V,VII: smile symmetric, facial light touch sensation normal bilaterally VIII: hearing  normal bilaterally IX,X: gag reflex present XI: bilateral shoulder shrug XII: midline tongue extension Motor:- Significant giveaway weakness On LUE Right : Upper extremity   5/5    Left:     Upper extremity   4/5  Lower extremity   5/5     Lower extremity   5/5 Tone and bulk:normal tone throughout; no atrophy noted Sensory: Pinprick and light touch intact throughout, bilaterally Deep Tendon Reflexes: 1+ and symmetric throughout Plantars: Right: downgoing   Left: downgoing Cerebellar: normal finger-to-nose, normal rapid alternating movements and normal heel-to-shin test Gait: not tested      Laboratory Studies:   Basic Metabolic Panel:  Recent Labs Lab 08/16/16 2014 08/17/16 0518  NA 140 140  K 4.9 3.7  CL 105 108  CO2 29 27  GLUCOSE 122* 119*  BUN 19 16  CREATININE 1.09* 0.84  CALCIUM 9.0 8.6*    Liver Function Tests:  Recent Labs Lab 08/16/16 2014 08/17/16 0518  AST 21 17  ALT 22 18  ALKPHOS 67 54  BILITOT 0.5 0.5  PROT 6.4* 5.2*  ALBUMIN 3.7 3.0*   No results for input(s): LIPASE, AMYLASE in the last 168 hours. No results for input(s): AMMONIA in the last 168 hours.  CBC:  Recent Labs Lab 08/16/16 2014 08/17/16 0518  WBC 7.2 4.7  NEUTROABS 4.4  --   HGB 15.7 13.6  HCT 45.5 39.0  MCV 88.4 88.3  PLT 249 197    Cardiac Enzymes:  Recent Labs Lab 08/16/16 2340 08/17/16 0518  TROPONINI <0.03 <0.03    BNP: Invalid input(s): POCBNP  CBG:  Recent Labs Lab 08/17/16 0009 08/17/16 0731  GLUCAP 120* 114*    Microbiology: No results found for this or any previous visit.  Coagulation Studies:  Recent Labs  08/16/16 2014  LABPROT 12.2  INR 0.91    Urinalysis:  Recent Labs Lab 08/16/16 2014  COLORURINE YELLOW*  YELLOW*  LABSPEC 1.025  1.025  PHURINE 5.0  5.0  GLUCOSEU NEGATIVE  NEGATIVE  HGBUR NEGATIVE  NEGATIVE  BILIRUBINUR NEGATIVE  NEGATIVE  KETONESUR NEGATIVE  NEGATIVE  PROTEINUR NEGATIVE  NEGATIVE  NITRITE  NEGATIVE  NEGATIVE  LEUKOCYTESUR NEGATIVE  NEGATIVE    Lipid Panel:     Component Value Date/Time   CHOL 198 08/17/2016 0518   TRIG 209 (H) 08/17/2016 0518   HDL 53 08/17/2016 0518   CHOLHDL 3.7 08/17/2016 0518   VLDL 42 (H) 08/17/2016 0518   LDLCALC 103 (H) 08/17/2016 0518    HgbA1C: No results found for: HGBA1C  Urine Drug Screen:     Component Value Date/Time   LABOPIA NONE DETECTED 08/16/2016 2014   COCAINSCRNUR NONE DETECTED 08/16/2016 2014   LABBENZ NONE DETECTED 08/16/2016 2014   AMPHETMU NONE DETECTED 08/16/2016 2014   THCU NONE DETECTED 08/16/2016 2014   LABBARB NONE DETECTED 08/16/2016 2014    Alcohol Level: No results for input(s): ETH in the last 168 hours.  Other results: EKG: normal EKG, normal sinus rhythm, unchanged from previous tracings.  Imaging: Ct Head Wo Contrast  Result Date: 08/16/2016 CLINICAL DATA:  Left arm weakness beginning at 1 o'clock today. EXAM: CT HEAD WITHOUT CONTRAST TECHNIQUE: Contiguous axial images were obtained from the base of the skull through the vertex without intravenous contrast. COMPARISON:  None. FINDINGS: Brain: No  acute infarct, hemorrhage, or mass lesion is present. The ventricles are of normal size. No significant extraaxial fluid collection is present. The brainstem and cerebellum are normal. Vascular: No hyperdense vessel or unexpected calcification. Skull: The calvarium is intact. No focal lytic or blastic lesions are present. Sinuses/Orbits: The paranasal sinuses and left mastoid air cells are clear. A wall down left mastoidectomy is noted. The globes and orbits are within normal limits bilaterally. Other: A right frontal hyperdense scalp lesion measures 11 mm, likely a sebaceous cyst. IMPRESSION: 1. Normal CT appearance of the brain. 2. Right mastoidectomy. Electronically Signed   By: Marin Roberts M.D.   On: 08/16/2016 20:18     Assessment/Plan: 46 y.o. female with a known history of fibromyalgia, OA, PE, RA,  HTN not medicated, major depression in the past was in a usual state of health until yesterday afternoon when she was donating blood she developed left anterior thigh numbness which progressed to her L arm.  Pt states her L arm felt a lot heavier.  Overnight symptoms have improved and close to baseline  - Significant giveaway weakness on examination with likely some confabulation component.  After encouragment she had full strength in the LUE.   - She is stating she is much better - awaiting MRI as I can't tell with 100 % certainty this is not a stroke. - ASA daily - pt/ot and d/c planning today.  08/17/2016, 10:45 AM

## 2016-08-17 NOTE — Discharge Instructions (Signed)
Aspirin and Your Heart Aspirin is a medicine that affects the way blood clots. Aspirin can be used to help reduce the risk of blood clots, heart attacks, and other heart-related problems. Should I take aspirin? Your health care provider will help you determine whether it is safe and beneficial for you to take aspirin daily. Taking aspirin daily may be beneficial if you:  Have had a heart attack or chest pain.  Have undergone open heart surgery such as coronary artery bypass surgery (CABG).  Have had coronary angioplasty.  Have experienced a stroke or transient ischemic attack (TIA).  Have peripheral vascular disease (PVD).  Have chronic heart rhythm problems such as atrial fibrillation. Are there any risks of taking aspirin daily? Daily use of aspirin can increase your risk of side effects. Some of these include:  Bleeding. Bleeding problems can be minor or serious. An example of a minor problem is a cut that does not stop bleeding. An example of a more serious problem is stomach bleeding or bleeding into the brain. Your risk of bleeding is increased if you are also taking non-steroidal anti-inflammatory medicine (NSAIDs).  Increased bruising.  Upset stomach.  An allergic reaction. People who have nasal polyps have an increased risk of developing an aspirin allergy. What are some guidelines I should follow when taking aspirin?  Take aspirin only as directed by your health care provider. Make sure you understand how much you should take and what form you should take. The two forms of aspirin are:  Non-enteric-coated. This type of aspirin does not have a coating and is absorbed quickly. Non-enteric-coated aspirin is usually recommended for people with chest pain. This type of aspirin also comes in a chewable form.  Enteric-coated. This type of aspirin has a special coating that releases the medicine very slowly. Enteric-coated aspirin causes less stomach upset than non-enteric-coated  aspirin. This type of aspirin should not be chewed or crushed.  Drink alcohol in moderation. Drinking alcohol increases your risk of bleeding. When should I seek medical care?  You have unusual bleeding or bruising.  You have stomach pain.  You have an allergic reaction. Symptoms of an allergic reaction include:  Hives.  Itchy skin.  Swelling of the lips, tongue, or face.  You have ringing in your ears. When should I seek immediate medical care?  Your bowel movements are bloody, dark red, or black in color.  You vomit or cough up blood.  You have blood in your urine.  You cough, wheeze, or feel short of breath. If you have any of the following symptoms, this is an emergency. Do not wait to see if the pain will go away. Get medical help at once. Call your local emergency services (911 in the [place] Do not drive yourself to the hospital.  You have severe chest pain, especially if the pain is crushing or pressure-like and spreads to the arms, back, neck, or jaw.  You have stroke-like symptoms, such as:  Loss of vision.  Difficulty talking.  Numbness or weakness on one side of your body.  Numbness or weakness in your arm or leg.  Not thinking clearly or feeling confused. This information is not intended to replace advice given to you by your health care provider. Make sure you discuss any questions you have with your health care provider. Document Released: 03/23/2008 Document Revised: 08/18/2015 Document Reviewed: 07/16/2013 Elsevier Interactive Patient Education  2017 Elsevier Inc.   Analgesic Rebound Headache An analgesic rebound headache, sometimes called a medication  overuse headache, is a headache that comes after pain medicine (analgesic) taken to treat the original (primary) headache has worn off. Any type of primary headache can return as a rebound headache if a person regularly takes analgesics more than three times a week to treat it. The types of primary  headaches that are commonly associated with rebound headaches include:  Migraines.  Headaches that arise from tense muscles in the head and neck area (tension headaches).  Headaches that develop and happen again (recur) on one side of the head and around the eye (cluster headaches). If rebound headaches continue, they become chronic daily headaches. What are the causes? This condition may be caused by frequent use of:  Over-the-counter medicines such as aspirin, ibuprofen, and acetaminophen.  Sinus relief medicines and other medicines that contain caffeine.  Narcotic pain medicines such as codeine and oxycodone. What are the signs or symptoms? The symptoms of a rebound headache are the same as the symptoms of the original headache. Some of the symptoms of specific types of headaches include: Migraine headache   Pulsing or throbbing pain on one or both sides of the head.  Severe pain that interferes with daily activities.  Pain that is worsened by physical activity.  Nausea, vomiting, or both.  Pain with exposure to bright light, loud noises, or strong smells.  General sensitivity to bright light, loud noises, or strong smells.  Visual changes.  Numbness of one or both arms. Tension headache   Pressure around the head.  Dull, aching head pain.  Pain felt over the front and sides of the head.  Tenderness in the muscles of the head, neck, and shoulders. Cluster headache   Severe pain that begins in or around one eye or temple.  Redness and tearing in the eye on the same side as the pain.  Droopy or swollen eyelid.  One-sided head pain.  Nausea.  Runny nose.  Sweaty, pale facial skin.  Restlessness. How is this diagnosed? This condition is diagnosed by:  Reviewing your medical history. This includes the nature of your primary headaches.  Reviewing the types of pain medicines that you have been using to treat your headaches and how often you take  them. How is this treated? This condition may be treated or managed by:  Discontinuing frequent use of the analgesic medicine. Doing this may worsen your headaches at first, but the pain should eventually become more manageable, less frequent, and less severe.  Seeing a headache specialist. He or she may be able to help you manage your headaches and help make sure there is not another cause of the headaches.  Using methods of stress relief, such as acupuncture, counseling, biofeedback, and massage. Talk with your health care provider about which methods might be good for you. Follow these instructions at home:  Take over-the-counter and prescription medicines only as told by your health care provider.  Stop the repeated use of pain medicine as told by your health care provider. Stopping can be difficult. Carefully follow instructions from your health care provider.  Avoid triggers that are known to cause your primary headaches.  Keep all follow-up visits as told by your health care provider. This is important. Contact a health care provider if:  You continue to experience headaches after following treatments that your health care provider recommended. Get help right away if:  You develop new headache pain.  You develop headache pain that is different than what you have experienced in the past.  You develop  numbness or tingling in your arms or legs.  You develop changes in your speech or vision. This information is not intended to replace advice given to you by your health care provider. Make sure you discuss any questions you have with your health care provider. Document Released: 07/01/2003 Document Revised: 10/29/2015 Document Reviewed: 09/13/2015 Elsevier Interactive Patient Education  2017 [employer].

## 2016-08-18 LAB — HEMOGLOBIN A1C
HEMOGLOBIN A1C: 5.9 % — AB (ref 4.8–5.6)
Mean Plasma Glucose: 123 mg/dL

## 2016-08-18 LAB — HIV ANTIBODY (ROUTINE TESTING W REFLEX): HIV SCREEN 4TH GENERATION: NONREACTIVE

## 2016-08-18 NOTE — Discharge Summary (Signed)
[facility] [place] [facility]   PATIENT NAME: Dawn Roberson    MR#:  [id number]  DATE OF BIRTH:  1970-06-24  DATE OF ADMISSION:  08/16/2016   ADMITTING PHYSICIAN: Tonye Royalty, DO  DATE OF DISCHARGE: 08/17/2016  4:40 PM  PRIMARY CARE PHYSICIAN: Rolm Gala, MD   ADMISSION DIAGNOSIS:   Left arm weakness [R29.898] Left sided numbness [R20.0] Acute nonintractable headache, unspecified headache type [R51]  DISCHARGE DIAGNOSIS:   Active Problems:   Left-sided weakness   SECONDARY DIAGNOSIS:   Past Medical History:  Diagnosis Date  . COPD (chronic obstructive pulmonary disease) (HCC)   . Fibromyalgia   . H/O diabetes mellitus    resolved with weight loss  . Osteoarthritis   . PE (pulmonary embolism) 2001  . Rheumatoid arthritis (HCC)   . Sleep apnea     HOSPITAL COURSE:   46 year old female with obesity, fibromyalgia, history of DVT and PE off of anticoagulation currently presents to hospital secondary to left upper extremity weakness.  #1 left upper extremity weakness-possible conversion disorder -However patient is under a lot of stress recently.Likely secondary to depression - Death recently of 2 close friends and financial troubles. -Appreciate neurology consult. Does not believe this is a stroke. -Carotid Dopplers with no hemodynamically significant stenosis. Patient couldn't get the MRI done due to her size, they wanted to try giving Ativan, however patient refused. Her symptoms were back to normal and so she is being discharged.  #2 depression and anxiety-continue Cymbalta, Wellbutrin and Effexor   Being discharged today. Requested to see her PCP within one week  DISCHARGE CONDITIONS:   Stable  CONSULTS OBTAINED:   Treatment Team:  Pauletta Browns, MD  DRUG ALLERGIES:   Allergies  Allergen Reactions  . Azithromycin Hives   DISCHARGE MEDICATIONS:   Allergies as of 08/17/2016      Reactions   Azithromycin Hives       Medication List    TAKE these medications   buPROPion 300 MG 24 hr tablet Commonly known as:  WELLBUTRIN XL Take 300 mg by mouth daily.   gabapentin 300 MG capsule Commonly known as:  NEURONTIN Take 300 mg by mouth at bedtime.   meloxicam 15 MG tablet Commonly known as:  MOBIC Take 15 mg by mouth daily.   venlafaxine XR 150 MG 24 hr capsule Commonly known as:  EFFEXOR-XR Take 300 mg by mouth daily.        DISCHARGE INSTRUCTIONS:   1. PCP f/u in 1-2 weeks  DIET:   Cardiac diet  ACTIVITY:   Activity as tolerated  OXYGEN:   Home Oxygen: No.  Oxygen Delivery: room air  DISCHARGE LOCATION:   home   If you experience worsening of your admission symptoms, develop shortness of breath, life threatening emergency, suicidal or homicidal thoughts you must seek medical attention immediately by calling 911 or calling your MD immediately  if symptoms less severe.  You Must read complete instructions/literature along with all the possible adverse reactions/side effects for all the Medicines you take and that have been prescribed to you. Take any new Medicines after you have completely understood and accpet all the possible adverse reactions/side effects.   Please note  You were cared for by a hospitalist during your hospital stay. If you have any questions about your discharge medications or the care you received while you were in the hospital after you are discharged, you can call the unit and asked to speak with the hospitalist on call  if the hospitalist that took care of you is not available. Once you are discharged, your primary care physician will handle any further medical issues. Please note that NO REFILLS for any discharge medications will be authorized once you are discharged, as it is imperative that you return to your primary care physician (or establish a relationship with a primary care physician if you do not have one) for your aftercare needs so that they can  reassess your need for medications and monitor your lab values.    On the day of Discharge:  VITAL SIGNS:   Blood pressure (!) 155/82, pulse 70, temperature 98 F (36.7 C), temperature source Oral, resp. rate 18, height 5\' 7"  (1.702 m), weight (!) 143.8 kg (317 lb), SpO2 97 %.  PHYSICAL EXAMINATION:    GENERAL:  [name] lying in the bed with no acute distress.  EYES: Pupils equal, round, reactive to light and accommodation. No scleral icterus. Extraocular muscles intact.  HEENT: Head atraumatic, normocephalic. Oropharynx and nasopharynx clear.  NECK:  Supple, no jugular venous distention. No thyroid enlargement, no tenderness.  LUNGS: Normal breath sounds bilaterally, no wheezing, rales,rhonchi or crepitation. No use of accessory muscles of respiration.  CARDIOVASCULAR: S1, S2 normal. No murmurs, rubs, or gallops.  ABDOMEN: Soft, nontender, nondistended. Bowel sounds present. No organomegaly or mass.  EXTREMITIES: No pedal edema, cyanosis, or clubbing.  NEUROLOGIC: Cranial nerves II through XII are intact. Muscle strength 5/5 in all extremities except left upper extremity as patient feels it is heavy, however when passively raised she is able to hold it in the air without any support.57and selective moving of the arm noted. Sensation intact. Gait not checked.  PSYCHIATRIC: The patient is alert and oriented x 3.  SKIN: No obvious rash, lesion, or ulcer.   DATA REVIEW:   CBC  Recent Labs Lab 08/17/16 0518  WBC 4.7  HGB 13.6  HCT 39.0  PLT 197    Chemistries   Recent Labs Lab 08/17/16 0518  NA 140  K 3.7  CL 108  CO2 27  GLUCOSE 119*  BUN 16  CREATININE 0.84  CALCIUM 8.6*  AST 17  ALT 18  ALKPHOS 54  BILITOT 0.5     Microbiology Results  No results found for this or any previous visit.  RADIOLOGY:  08/19/16 Carotid Bilateral (at [facility])  Result Date: 08/17/2016 CLINICAL DATA:  TIA.  Left arm weakness yesterday. EXAM: BILATERAL  CAROTID DUPLEX ULTRASOUND TECHNIQUE: 08/19/2016 scale imaging, color Doppler and duplex ultrasound were performed of bilateral carotid and vertebral arteries in the neck. COMPARISON:  None. FINDINGS: Criteria: Quantification of carotid stenosis is based on velocity parameters that correlate the residual internal carotid diameter with NASCET-based stenosis levels, using the diameter of the distal internal carotid lumen as the denominator for stenosis measurement. The following velocity measurements were obtained: RIGHT ICA:  110 cm/sec CCA:  116 cm/sec SYSTOLIC ICA/CCA RATIO:  1.0 DIASTOLIC ICA/CCA RATIO:  1.6 ECA:  128 cm/sec LEFT ICA:  95 cm/sec CCA:  96 cm/sec SYSTOLIC ICA/CCA RATIO:  1.0 DIASTOLIC ICA/CCA RATIO:  1.2 ECA:  93 cm/sec RIGHT CAROTID ARTERY: Little if any plaque in the bulb. Low resistance internal carotid Doppler pattern. RIGHT VERTEBRAL ARTERY:  Antegrade. LEFT CAROTID ARTERY: Mild smooth mixed plaque along the wall of the bulb. There is focal irregular mild plaque in the mid internal carotid artery. Low resistance internal carotid Doppler pattern. LEFT VERTEBRAL ARTERY:  Antegrade. IMPRESSION: Less than 50% stenosis in  the right and left internal carotid arteries. Electronically Signed   By: Jolaine Click M.D.   On: 08/17/2016 14:53     Management plans discussed with the patient, family and they are in agreement.  CODE STATUS:  Code Status History    Date Active Date Inactive Code Status Order ID Comments User Context   08/16/2016 11:22 PM 08/17/2016  8:11 PM Full Code [id number]  Tonye Royalty, DO Inpatient   10/20/2014 12:30 PM 10/20/2014  5:00 PM Full Code [id number]  Laurier Nancy, MD Inpatient   10/14/2014 11:36 PM 10/15/2014  6:26 PM Full Code [id number]  Oralia Manis, MD ED      TOTAL TIME TAKING CARE OF THIS PATIENT: 37 minutes.    Enid Baas M.D on 08/18/2016 at 2:21 PM  Between 7am to 6pm - Pager - [phone]  After 6pm go to [email or web address] - Air traffic controller  [facility]  Office  [phone]  CC: Primary care physician; Rolm Gala, MD   Note: This dictation was prepared with Dragon dictation along with smaller phrase technology. Any transcriptional errors that result from this process are unintentional.

## 2018-03-21 IMAGING — CT CT HEAD W/O CM
3 series · 15 of 47 positions shown, 18 images · non-contrast
Comparison: None.

CLINICAL DATA: Left arm weakness beginning at 1 o'clock today.

EXAM:
CT HEAD WITHOUT CONTRAST
TECHNIQUE: Contiguous axial images were obtained from the base of the skull
through the vertex without intravenous contrast.

[Series 2: head wo · axial · 0.44mm/px · z∈[+163,+288]mm · 9 of 31 slices shown, 12 images]
[im 3/31  brain]
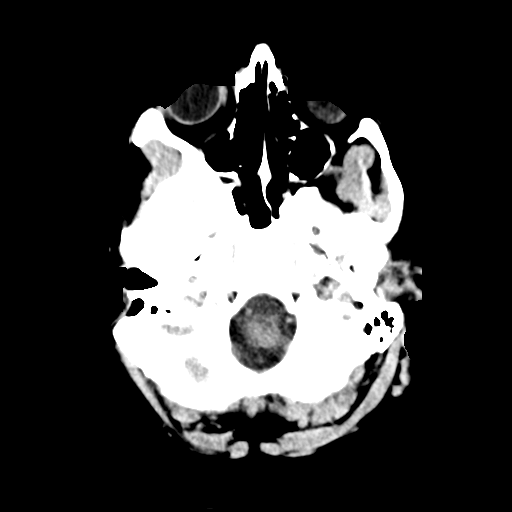
[im 3/31  bone]
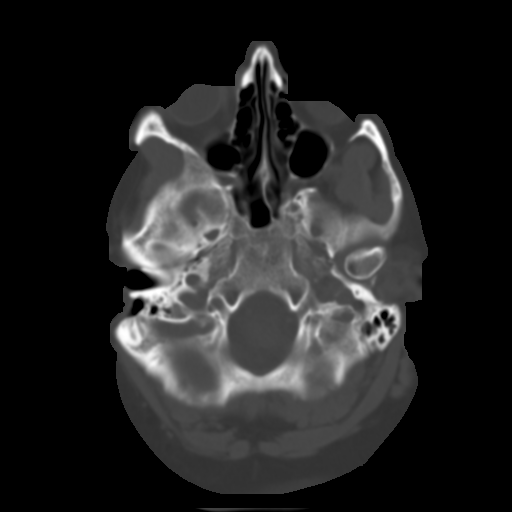
[im 6/31  brain]
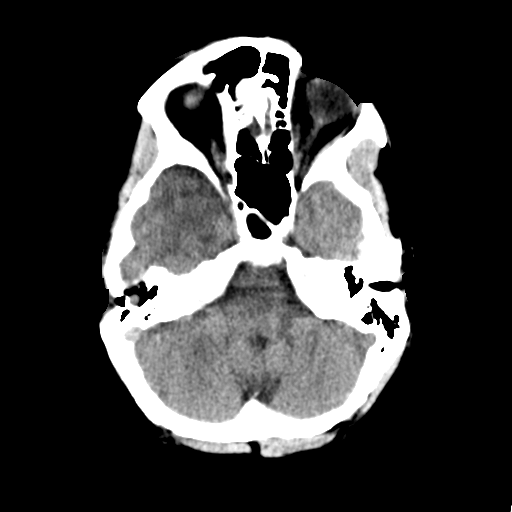
[im 9/31  brain]
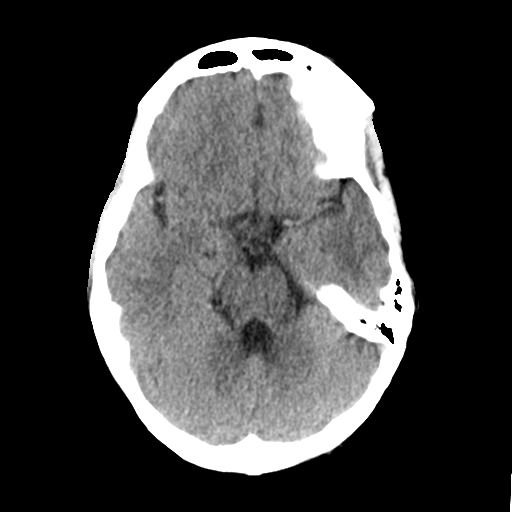
[im 12/31  brain]
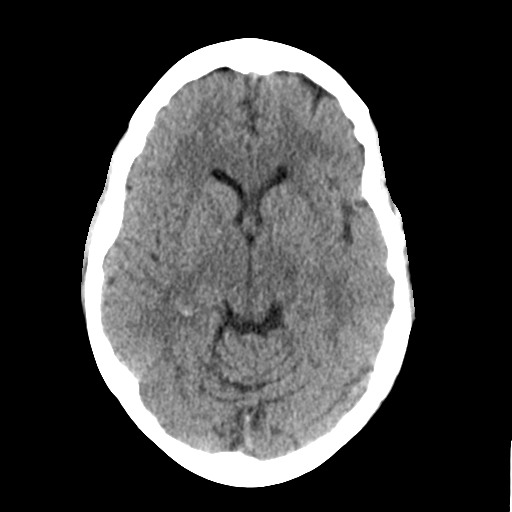
[im 16/31  brain]
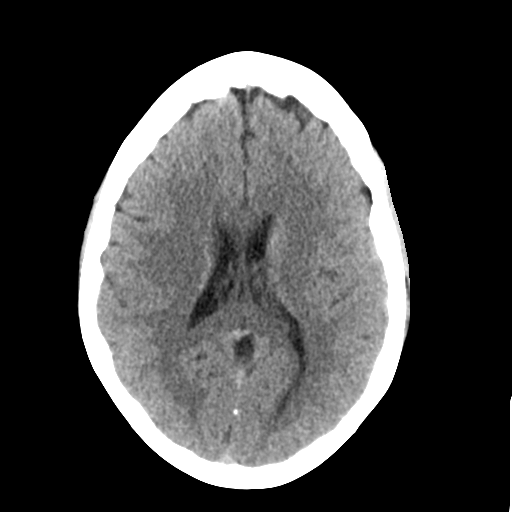
[im 16/31  bone]
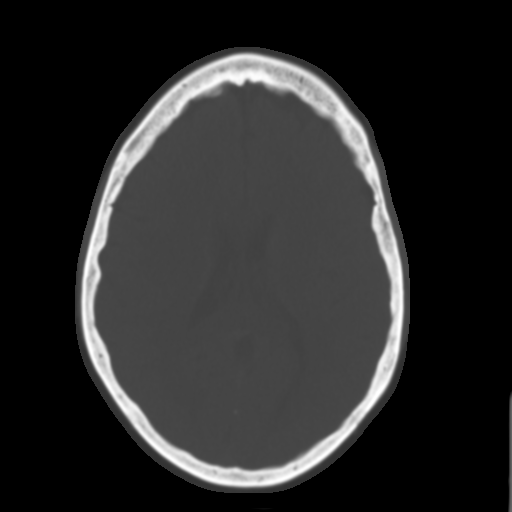
[im 19/31  brain]
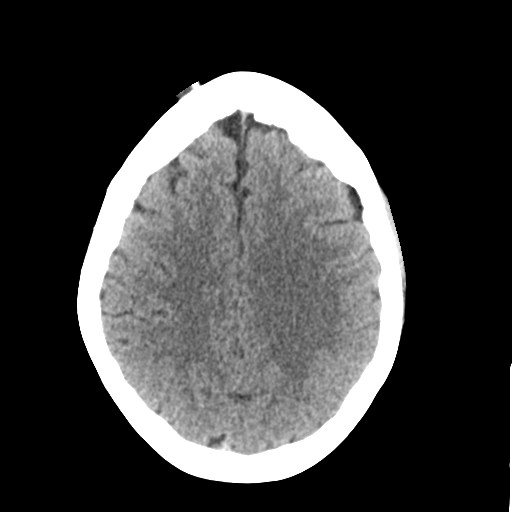
[im 22/31  brain]
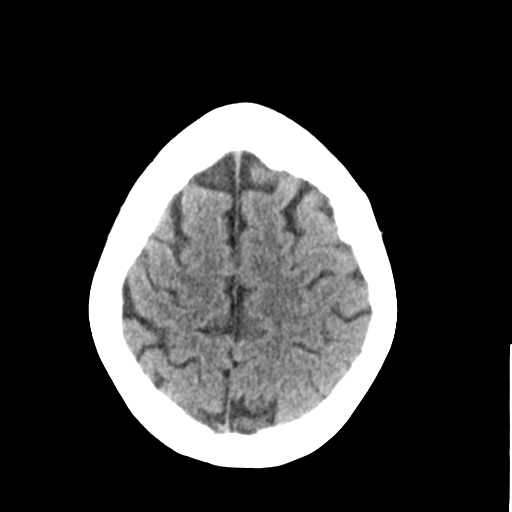
[im 25/31  brain]
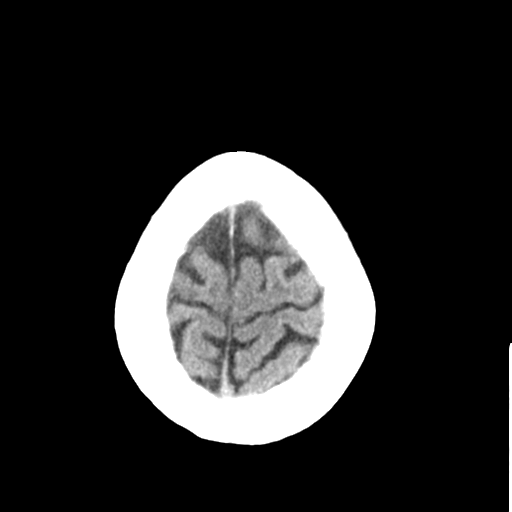
[im 28/31  brain]
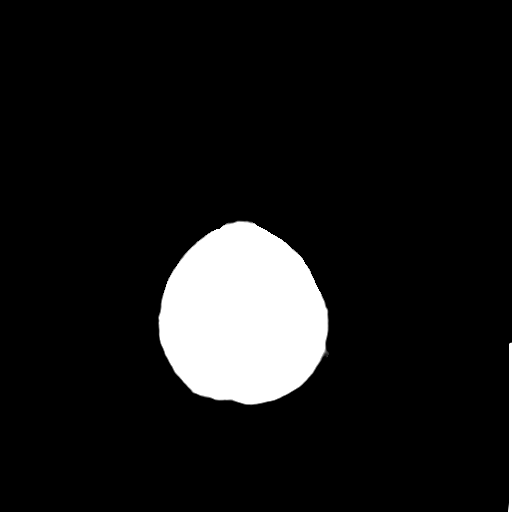
[im 28/31  bone]
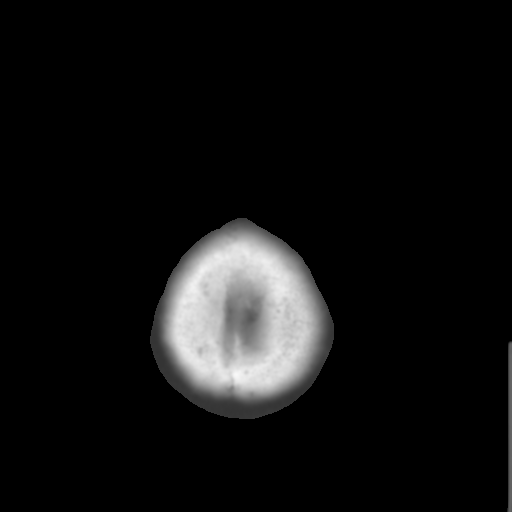

[Series 4: coronal soft tissue · coronal · 0.31mm/px · 3 of 66 slices shown]
[im 22/66  brain]
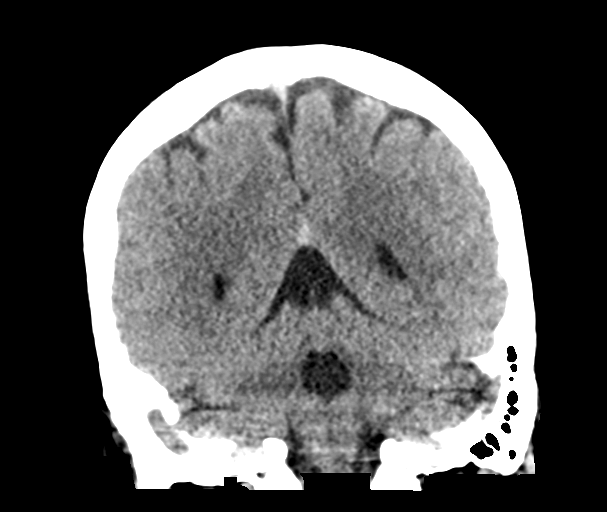
[im 29/66  brain]
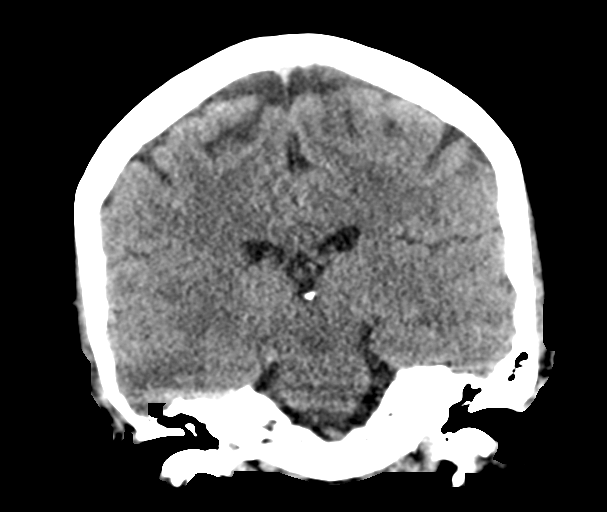
[im 37/66  brain]
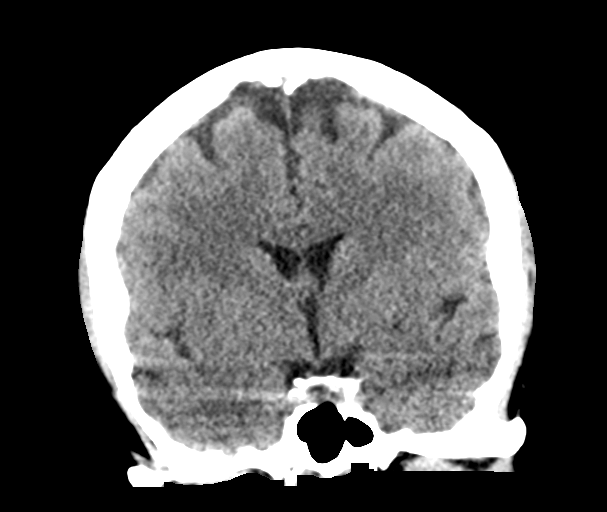

[Series 5: sagittal soft tissue · sagittal · 0.32mm/px · 3 of 52 slices shown]
[im 18/52  brain]
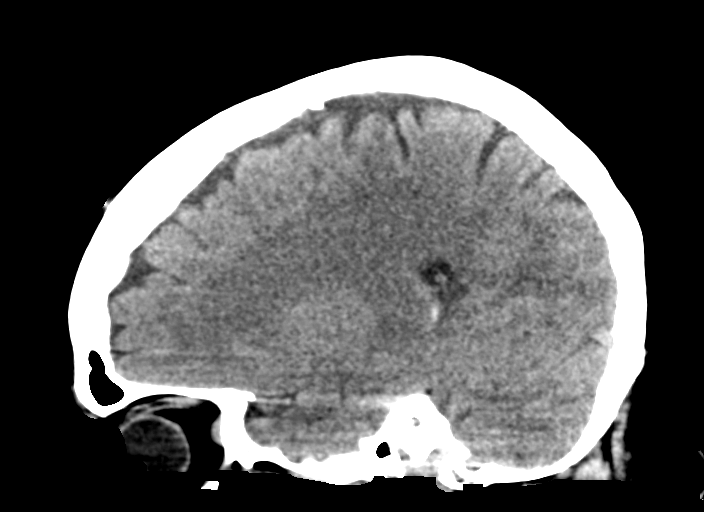
[im 26/52  brain]
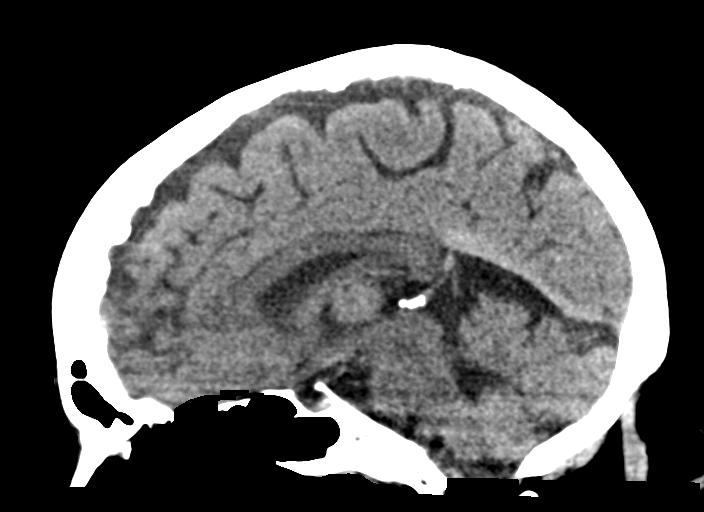
[im 35/52  brain]
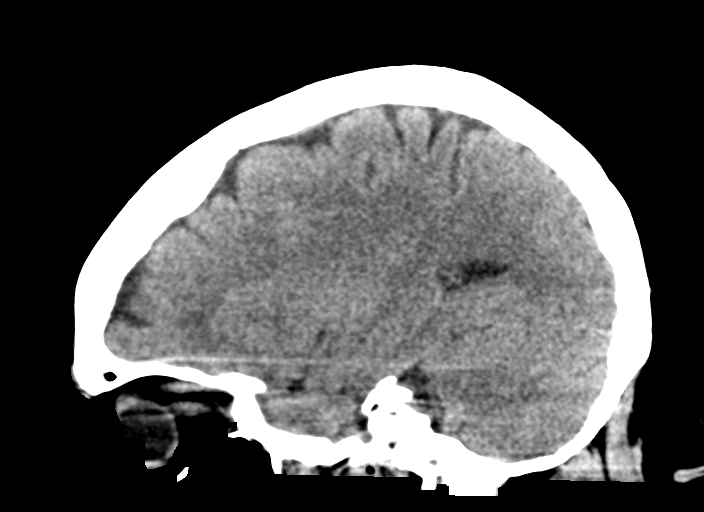

[15 of 47 positions shown; findings below may reference images not displayed]

FINDINGS: Brain: No acute infarct, hemorrhage, or mass lesion is present. The
ventricles are of normal size. No significant extraaxial fluid
collection is present. The brainstem and cerebellum are normal.

Vascular: No hyperdense vessel or unexpected calcification.

Skull: The calvarium is intact. No focal lytic or blastic lesions
are present.

Sinuses/Orbits: The paranasal sinuses and left mastoid air cells are
clear. A wall down left mastoidectomy is noted. The globes and
orbits are within normal limits bilaterally.

Other: A right frontal hyperdense scalp lesion measures 11 mm,
likely a sebaceous cyst.
IMPRESSION: 1. Normal CT appearance of the brain.
2. Right mastoidectomy.

## 2018-07-16 ENCOUNTER — Other Ambulatory Visit: Payer: Self-pay | Admitting: Family Medicine

## 2018-07-16 DIAGNOSIS — J90 Pleural effusion, not elsewhere classified: Secondary | ICD-10-CM

## 2018-07-18 ENCOUNTER — Ambulatory Visit
Admission: RE | Admit: 2018-07-18 | Discharge: 2018-07-18 | Disposition: A | Discharge status: Home / Self Care | Payer: Medicare HMO | Source: Ambulatory Visit | Attending: Family Medicine | Admitting: Family Medicine

## 2018-07-18 ENCOUNTER — Other Ambulatory Visit: Payer: Self-pay

## 2018-07-18 DIAGNOSIS — J90 Pleural effusion, not elsewhere classified: Secondary | ICD-10-CM | POA: Insufficient documentation

## 2018-09-26 DIAGNOSIS — G4733 Obstructive sleep apnea (adult) (pediatric): Secondary | ICD-10-CM | POA: Insufficient documentation

## 2019-04-30 ENCOUNTER — Other Ambulatory Visit: Payer: Self-pay | Admitting: Family Medicine

## 2019-04-30 ENCOUNTER — Ambulatory Visit: Payer: Self-pay | Admitting: Physician Assistant

## 2019-04-30 ENCOUNTER — Other Ambulatory Visit: Payer: Self-pay

## 2019-04-30 DIAGNOSIS — A539 Syphilis, unspecified: Secondary | ICD-10-CM

## 2019-04-30 DIAGNOSIS — Z113 Encounter for screening for infections with a predominantly sexual mode of transmission: Secondary | ICD-10-CM

## 2019-05-01 ENCOUNTER — Encounter: Payer: Self-pay | Admitting: Physician Assistant

## 2019-05-01 DIAGNOSIS — A539 Syphilis, unspecified: Secondary | ICD-10-CM | POA: Insufficient documentation

## 2019-05-01 LAB — WET PREP FOR TRICH, YEAST, CLUE
Trichomonas Exam: NEGATIVE
Yeast Exam: NEGATIVE

## 2019-05-01 NOTE — Progress Notes (Signed)
[facility] STI clinic/screening visit  Subjective:  Dawn Roberson is a 49 y.o. female being seen today for an STI screening visit. The patient reports they do have symptoms.  Patient reports that they do not desire a pregnancy in the next year.   They reported they are not interested in discussing contraception today.  No LMP recorded (approximate). Patient has had a hysterectomy.   Patient has the following medical conditions:   Patient Active Problem List   Diagnosis Date Noted  . Left-sided weakness 08/16/2016  . Chest pain 10/14/2014  . Dyspnea on exertion 10/14/2014  . Fibromyalgia 10/14/2014  . Rheumatoid arthritis ([place]) 10/14/2014  . DOE (dyspnea on exertion) 10/14/2014    Chief Complaint  Patient presents with  . SEXUALLY TRANSMITTED DISEASE    HPI  Patient reports that she has had vaginal burning and odor for about 3 weeks.  Reports that she had some "bumps" in the pubic area about 2 weeks ago that have now resolved.  Denies other symptoms.  See flowsheet for further details and programmatic requirements.    The following portions of the patient's history were reviewed and updated as appropriate: allergies, current medications, past medical history, past social history, past surgical history and problem list.  Objective:  There were no vitals filed for this visit.  Physical Exam Constitutional:      General: She is not in acute distress.    Appearance: Normal appearance. She is obese.  HENT:     Head: Normocephalic and atraumatic.     Comments: No nits, lice, or hair loss. No cervical, supraclavicular or axillary adenopathy.    Mouth/Throat:     Mouth: Mucous membranes are moist.     Pharynx: Oropharynx is clear. No oropharyngeal exudate or posterior oropharyngeal erythema.  Eyes:     Conjunctiva/sclera: Conjunctivae normal.  Pulmonary:     Effort: Pulmonary effort is normal.  Abdominal:     Palpations: Abdomen is soft. There is  no mass.     Tenderness: There is no abdominal tenderness. There is no guarding or rebound.  Genitourinary:    General: Normal vulva.     Rectum: Normal.     Comments: External genitalia/pubic area without nits, lice, edema, erythema, lesions, and inguinal adenopathy. Vagina with normal mucosa and no discharge. Cervix and uterus surgically absent. No adnexal tenderness or fullness. Musculoskeletal:     Cervical back: Neck supple. No tenderness.  Skin:    General: Skin is warm and dry.     Findings: No bruising, erythema, lesion or rash.  Neurological:     Mental Status: She is alert and oriented to person, place, and time.  Psychiatric:        Mood and Affect: Mood normal.        Behavior: Behavior normal.        Thought Content: Thought content normal.        Judgment: Judgment normal.      Assessment and Plan:  Dawn Roberson is a 49 y.o. female presenting to the [facility] for STI screening  1. Screening for STD (sexually transmitted disease) Patient into clinic with symptoms.  Some of the symptoms have resolved. Rec condoms with all sex. Await test results.  Counseled that RN will call if needs to RTC for treatment once results are back. Counseled that she can ask PCP to draw HSV serology due to her concerns, at her next visit. - Syphilis Serology, [facility] -  HIV [facility] - Chlamydia/Gonorrhea [facility] - Gonococcus culture - WET PREP FOR TRICH, YEAST, CLUE     No follow-ups on file.  No future appointments.  Matt Holmes, PA

## 2019-05-05 LAB — GONOCOCCUS CULTURE

## 2019-05-05 NOTE — Progress Notes (Signed)
Wet mount reviewed with provider and is negative, so no treatment needed for wet mount per standing order and per provider. Awaiting further test results Provider orders completed.Lyman Speller, RN

## 2019-06-09 NOTE — Addendum Note (Signed)
Addended by: Richmond Campbell on: 06/09/2019 03:57 PM   Modules accepted: Orders

## 2019-07-28 ENCOUNTER — Ambulatory Visit: Payer: Medicare HMO

## 2019-08-02 ENCOUNTER — Ambulatory Visit: Payer: Medicare HMO | Attending: Internal Medicine

## 2019-08-02 DIAGNOSIS — Z23 Encounter for immunization: Secondary | ICD-10-CM

## 2019-08-02 NOTE — Progress Notes (Addendum)
   Covid-19 Vaccination Clinic  Name:  Dawn Roberson    MRN: [id number] DOB: Jan 14, 1971  08/02/2019  Dawn Roberson was observed post Covid-19 immunization for 30 minutes without incident. She was provided with Vaccine Information Sheet and instruction to access the V-Safe system.   Dawn Roberson was instructed to call 911 with any severe reactions post vaccine: Marland Kitchen Difficulty breathing  . Swelling of face and throat  . A fast heartbeat  . A bad rash all over body  . Dizziness and weakness

## 2019-08-26 ENCOUNTER — Ambulatory Visit: Payer: Medicare HMO | Attending: Internal Medicine

## 2019-08-26 DIAGNOSIS — Z23 Encounter for immunization: Secondary | ICD-10-CM

## 2019-08-26 NOTE — Progress Notes (Signed)
   Covid-19 Vaccination Clinic  Name:  Dawn Roberson    MRN: [id number] DOB: [birth year]  08/26/2019  [name] was observed post Covid-19 immunization for 15 minutes without incident. She was provided with Vaccine Information Sheet and instruction to access the V-Safe system.   [name] was instructed to call 911 with any severe reactions post vaccine: Marland Kitchen Difficulty breathing  . Swelling of face and throat  . A fast heartbeat  . A bad rash all over body  . Dizziness and weakness   Immunizations Administered    Name Date Dose VIS Date Route   Pfizer COVID-19 Vaccine 08/26/2019  1:24 PM 0.3 mL 06/18/2018 Intramuscular   Manufacturer: [employer], [employer]   Lot: [id number]   NDC: [address]-5

## 2019-12-09 DIAGNOSIS — B001 Herpesviral vesicular dermatitis: Secondary | ICD-10-CM | POA: Insufficient documentation

## 2021-01-10 ENCOUNTER — Other Ambulatory Visit: Payer: Self-pay | Admitting: Student

## 2021-01-10 DIAGNOSIS — G8929 Other chronic pain: Secondary | ICD-10-CM

## 2021-01-10 DIAGNOSIS — M25562 Pain in left knee: Secondary | ICD-10-CM

## 2021-01-26 ENCOUNTER — Other Ambulatory Visit: Payer: Self-pay

## 2021-01-26 ENCOUNTER — Ambulatory Visit
Admission: RE | Admit: 2021-01-26 | Discharge: 2021-01-26 | Disposition: A | Discharge status: Home / Self Care | Payer: Medicare HMO | Source: Ambulatory Visit | Attending: Student | Admitting: Student

## 2021-01-26 DIAGNOSIS — M25562 Pain in left knee: Secondary | ICD-10-CM | POA: Diagnosis present

## 2021-01-26 DIAGNOSIS — G8929 Other chronic pain: Secondary | ICD-10-CM | POA: Diagnosis present

## 2021-01-31 DIAGNOSIS — F17209 Nicotine dependence, unspecified, with unspecified nicotine-induced disorders: Secondary | ICD-10-CM | POA: Insufficient documentation

## 2021-06-29 DIAGNOSIS — R7303 Prediabetes: Secondary | ICD-10-CM | POA: Insufficient documentation

## 2021-07-06 DIAGNOSIS — F17201 Nicotine dependence, unspecified, in remission: Secondary | ICD-10-CM | POA: Insufficient documentation

## 2021-07-29 DIAGNOSIS — Z9884 Bariatric surgery status: Secondary | ICD-10-CM | POA: Insufficient documentation

## 2022-01-19 DIAGNOSIS — E663 Overweight: Secondary | ICD-10-CM | POA: Insufficient documentation

## 2022-01-19 DIAGNOSIS — K5909 Other constipation: Secondary | ICD-10-CM | POA: Insufficient documentation

## 2022-08-25 DIAGNOSIS — G459 Transient cerebral ischemic attack, unspecified: Secondary | ICD-10-CM | POA: Insufficient documentation

## 2023-03-28 DIAGNOSIS — I10 Essential (primary) hypertension: Secondary | ICD-10-CM

## 2023-03-28 DIAGNOSIS — E782 Mixed hyperlipidemia: Secondary | ICD-10-CM | POA: Insufficient documentation

## 2023-03-28 HISTORY — DX: Essential (primary) hypertension: I10

## 2023-05-29 ENCOUNTER — Ambulatory Visit: Payer: Medicare HMO | Admitting: Podiatry

## 2023-06-04 ENCOUNTER — Ambulatory Visit: Payer: Medicare HMO | Admitting: Podiatry

## 2023-06-04 ENCOUNTER — Ambulatory Visit (INDEPENDENT_AMBULATORY_CARE_PROVIDER_SITE_OTHER): Payer: Medicare HMO

## 2023-06-04 ENCOUNTER — Encounter: Payer: Self-pay | Admitting: Podiatry

## 2023-06-04 DIAGNOSIS — M2012 Hallux valgus (acquired), left foot: Secondary | ICD-10-CM

## 2023-06-04 DIAGNOSIS — M19072 Primary osteoarthritis, left ankle and foot: Secondary | ICD-10-CM | POA: Diagnosis not present

## 2023-06-04 DIAGNOSIS — M7752 Other enthesopathy of left foot: Secondary | ICD-10-CM

## 2023-06-04 MED ORDER — TRIAMCINOLONE ACETONIDE 40 MG/ML IJ SUSP
20.0000 mg | Freq: Once | INTRAMUSCULAR | Status: AC
  Administered 2023-06-04: 20 mg

## 2023-06-04 NOTE — Progress Notes (Signed)
 Subjective:  Patient ID: Dawn Roberson, female    DOB: 05/04/1970,  MRN: [id number] HPI Chief Complaint  Patient presents with   Foot Pain    1st MPJ left - previous bunion surgery by Dr Celia Coles in 1997, thinks the bunion is coming back, having increased pain in the area, also pain is radiating into anterior ankle    New Patient (Initial Visit)    53 y.o. female presents with the above complaint.   ROS: Denies fever chills nausea vomit muscle aches pains calf pain back pain chest pain shortness of breath.  Past Medical History:  Diagnosis Date   COPD (chronic obstructive pulmonary disease) (HCC)    Essential hypertension 03/28/2023   Fibromyalgia    H/O diabetes mellitus    resolved with weight loss   Osteoarthritis    PE (pulmonary embolism) 2001   Rheumatoid arthritis (HCC)    Sleep apnea    Past Surgical History:  Procedure Laterality Date   ABDOMINAL HYSTERECTOMY     bunions, bilater     CARDIAC CATHETERIZATION N/A 10/20/2014   Procedure: Left Heart Cath;  Surgeon: Cherrie Cornwall, MD;  Location: [facility];  Service: Cardiovascular;  Laterality: N/A;    Current Outpatient Medications:    albuterol (VENTOLIN HFA) 108 (90 Base) MCG/ACT inhaler, Inhale into the lungs., Disp: , Rfl:    atorvastatin (LIPITOR) 20 MG tablet, Take by mouth., Disp: , Rfl:    celecoxib (CELEBREX) 200 MG capsule, Take by mouth., Disp: , Rfl:    citalopram (CELEXA) 40 MG tablet, Take by mouth., Disp: , Rfl:    hydroxychloroquine (PLAQUENIL) 200 MG tablet, Take 1 tablet by mouth 2 (two) times daily., Disp: , Rfl:    linaclotide (LINZESS) 72 MCG capsule, Take by mouth., Disp: , Rfl:    LORazepam (ATIVAN) 0.5 MG tablet, Take 0.5 mg by mouth every 8 (eight) hours as needed., Disp: , Rfl:    metoprolol succinate (TOPROL-XL) 25 MG 24 hr tablet, Take 1 tablet by mouth daily., Disp: , Rfl:    pantoprazole  (PROTONIX ) 40 MG tablet, Take by mouth., Disp: , Rfl:    pregabalin (LYRICA) 25 MG capsule,  Take by mouth., Disp: , Rfl:    traZODone (DESYREL) 50 MG tablet, Take 1 tablet by mouth at bedtime as needed., Disp: , Rfl:    valACYclovir (VALTREX) 500 MG tablet, Take by mouth., Disp: , Rfl:    varenicline (CHANTIX) 1 MG tablet, Take by mouth., Disp: , Rfl:    buPROPion  (WELLBUTRIN  XL) 300 MG 24 hr tablet, Take 300 mg by mouth daily., Disp: , Rfl:    diclofenac Sodium (VOLTAREN) 1 % GEL, Apply 2 g topically 4 (four) times daily., Disp: , Rfl:    ondansetron  (ZOFRAN ) 4 MG tablet, Take by mouth., Disp: , Rfl:   Allergies  Allergen Reactions   Azithromycin Hives and Itching   Other     zpac   Review of Systems Objective:  There were no vitals filed for this visit.  General: Well developed, nourished, in no acute distress, alert and oriented x3   Dermatological: Skin is warm, dry and supple bilateral. Nails x 10 are well maintained; remaining integument appears unremarkable at this time. There are no open sores, no preulcerative lesions, no rash or signs of infection present.  Vascular: Dorsalis Pedis artery and Posterior Tibial artery pedal pulses are 2/4 bilateral with immedate capillary fill time. Pedal hair growth present. No varicosities and no lower extremity edema present bilateral.  Neruologic: Grossly intact via light touch bilateral. Vibratory intact via tuning fork bilateral. Protective threshold with Semmes Wienstein monofilament intact to all pedal sites bilateral. Patellar and Achilles deep tendon reflexes 2+ bilateral. No Babinski or clonus noted bilateral.   Musculoskeletal: No gross boney pedal deformities bilateral. No pain, crepitus, or limitation noted with foot and ankle range of motion bilateral. Muscular strength 5/5 in all groups tested bilateral.  Mild hallux abductovalgus deformity of the left foot with some tenderness on range of motion particularly end range of motion of the left foot.  She also has significant nodularity to the tarsometatarsal joints dorsally.   No replication of pain on palpation.  Though it is exquisitely tender with frontal plane range of motion and direct palpation.  Gait: Unassisted, Nonantalgic.    Radiographs:  Radiographs of the left foot today demonstrates mild demineralization of the bone.  Capital osteotomy which is healed first metatarsal phalangeal joint with some early osteoarthritic changes.  These changes are consistent here as well as in the tarsometatarsal joints with some dorsal spurring.  Assessment & Plan:   Assessment: Capsulitis of the first metatarsophalangeal joints and sesamoiditis.  Significant osteoarthritis tarsometatarsal joints left foot  Plan: Injected the distal aspect of the first intermetatarsal space close to the fibular sesamoid.  Tolerated procedure well without complications.  Was given both oral and written home-going instructions.  Follow-up with her on an as-needed basis.     Dawn Amparo T. [place], [place]

## 2023-12-03 ENCOUNTER — Ambulatory Visit: Admitting: Podiatry

## 2024-01-28 ENCOUNTER — Ambulatory Visit: Admitting: Podiatry

## 2024-01-29 ENCOUNTER — Ambulatory Visit: Admitting: Podiatry

## 2024-01-29 DIAGNOSIS — M7752 Other enthesopathy of left foot: Secondary | ICD-10-CM

## 2024-01-29 NOTE — Progress Notes (Unsigned)
 Subjective:  Patient ID: Dawn Roberson, female    DOB: [birth year],  MRN: [id number]  No chief complaint on file.   53 y.o. female presents with the above complaint.  Patient presents with follow-up of left second metatarsophalangeal joint pain.  She states started coming back is causing her some more issues.  He gave her good 6 months of relief.  She would like to know if she can do another injection.   Review of Systems: Negative except as noted in the HPI. Denies N/V/F/Ch.  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   Fibromyalgia    HSV-1 infection    genital   Hypertension    Hypothyroidism    Migraines    PONV (postoperative nausea and vomiting)    Thyroid disease    Wears glasses     Current Outpatient Medications:    acetaminophen (TYLENOL) 650 MG CR tablet, Take 1,300 mg by mouth in the morning and at bedtime., Disp: , Rfl:    adalimumab (HUMIRA, 2 PEN,) 40 MG/0.4ML pen, Inject 40 mg (1 pen) every other week for 30 days., Disp: 2 each, Rfl: 5   calcitRIOL (ROCALTROL) 0.5 MCG capsule, Take 1 capsule (0.5 mcg total) by mouth daily., Disp: 90 capsule, Rfl: 3   Calcium Carb-Cholecalciferol 600-20 MG-MCG TABS, Take 1 tablet by mouth every evening., Disp: , Rfl:    celecoxib (CELEBREX) 200 MG capsule, Take 1 capsule (200 mg total) by mouth 2 (two) times daily., Disp: 180 capsule, Rfl: 0   clotrimazole-betamethasone (LOTRISONE) cream, Apply 1 Application topically daily., Disp: 30 g, Rfl: 0   cyclobenzaprine (FLEXERIL) 10 MG tablet, Take 1 tablet (10 mg total) by mouth at bedtime as needed for muscle spasms., Disp: 30 tablet, Rfl: 2   diazepam (VALIUM) 10 MG tablet, Take 1 tablet (10 mg total) by mouth every 12 (twelve) hours as needed for anxiety., Disp: 10 tablet, Rfl: 0   fluconazole (DIFLUCAN) 150 MG tablet, Take 1  now and 1 in 3 days, Disp: 2 tablet, Rfl: 1   imiquimod (ALDARA) 5 % cream, Apply topically 3 (three) times a week., Disp: 12 each, Rfl: 2   levothyroxine (SYNTHROID) 200 MCG tablet, Take 1 tablet (200 mcg total) by mouth daily., Disp: 90 tablet, Rfl: 3   lisinopril (ZESTRIL) 40 MG tablet, Take 1 tablet (40 mg total) by mouth every evening., Disp: 90 tablet, Rfl: 4   meclizine (ANTIVERT) 25 MG tablet, Take 1 tablet (25 mg total) by mouth 3 (three) times daily as needed for dizziness., Disp: 30 tablet, Rfl: 0   Omega-3 Fatty Acids (FISH OIL PO), Take 1,400 mg by mouth every evening., Disp: , Rfl:    ondansetron (ZOFRAN) 4 MG tablet, Take 1 tablet (4 mg total) by mouth every 8 (eight) hours as needed for nausea or vomiting., Disp: 20 tablet, Rfl: 0   valACYclovir (VALTREX) 500 MG tablet, Take 1 tablet (500 mg total) by mouth daily., Disp: 90 tablet, Rfl: 4  Social History   Tobacco Use  Smoking Status Former   Current packs/day: 0.00   Average packs/day: 1 pack/day for 7.0 years (7.0 ttl pk-yrs)   Types: Cigarettes   Start date: 04/24/1996   Quit date: 04/25/2003   Years since quitting: 19.9  Smokeless Tobacco Never    Allergies  Allergen Reactions  Azithromycin Swelling   Omnicef [Cefdinir] Diarrhea   Topamax Itching   Topiramate Itching   Other Hives, Itching and Rash    Tape    Penicillins Rash   Objective:  There were no vitals filed for this visit. Body mass index is 43.86 kg/m. Constitutional Well developed. Well nourished.  Vascular Dorsalis pedis pulses palpable bilaterally. Posterior tibial pulses palpable bilaterally. Capillary refill normal to all digits.  No cyanosis or clubbing noted. Pedal hair growth normal.  Neurologic Normal speech. Oriented to person, place, and time. Epicritic sensation to light touch grossly present bilaterally.  Dermatologic Nails well groomed and normal in appearance. No open wounds. No skin lesions.  Orthopedic:  Pain with range of  motion of left second metatarsophalangeal joint.  Pain on palpation to the joint directly.  No deep intra-articular second MTPJ joint noted.  Negative Mulder sign noted.  No Mulder's click noted.  No signs of neuroma noted.  No extensor or flexor tendinitis noted.   Radiographs: 3 views of skeletally mature adult left foot: Metatarsal parabola is intact.  Hammertoe contractures noted.  No bunion deformity noted.  Slight decrease in intermetatarsal space between the second and third metatarsal head noted.  Negative Sullivan sign. Assessment:   No diagnosis found.   Plan:  Patient was evaluated and treated and all questions answered.  Left second metatarsophalangeal joint capsulitis -I explained to the patient the etiology of capsulitis and various treatment options were extensively discussed.  Given the amount of pain she is having I believe she will benefit from a steroid injection help decrease acute inflammatory component associate with pain.  Patient agrees with the plan like to proceed with a steroid injection. -A second steroid injection was performed at left second MTP using 1% plain Lidocaine and 10 mg of Kenalog. This was well tolerated. -She has been wearing orthotics and is functioning well in them.  No follow-ups on file.

## 2024-05-08 ENCOUNTER — Ambulatory Visit

## 2024-05-08 DIAGNOSIS — L820 Inflamed seborrheic keratosis: Secondary | ICD-10-CM

## 2024-05-08 NOTE — Progress Notes (Signed)
 SABRA

## 2024-05-08 NOTE — Patient Instructions (Signed)

## 2024-05-08 NOTE — Progress Notes (Signed)
" °  °  Subjective   Dawn Roberson is a 54 y.o. female who presents for the following: Lesion(s) of concern . Patient is new patient  Today patient reports: LOC on right forearm x6 months   Review of Systems:    No other skin or systemic complaints except as noted in HPI or Assessment and Plan.  The following portions of the chart were reviewed this encounter and updated as appropriate: medications, allergies, medical history  Relevant Medical History:  Family history of skin cancer - Cousin; Melanoma    Objective  (SKPE) Well appearing patient in no apparent distress; mood and affect are within normal limits. Examination was performed of the: Focused Exam of: Right upper extremity    Examination notable for: Seborrheic Keratosis(es): Stuck-on appearing keratotic papule(s) on the trunk, some  irritated with redness, crusting, edema, and/or partial avulsion  Examination limited by: Clothing and Patient deferred removal     Right Forearm Stuck on waxy paps with erythema  Assessment & Plan  (SKAP)   Seborrheic keratosis, inflamed - Discussed diagnosis, typical course, and treatment options for this condition - Reassurance, benign, monitor - ABCDE's discussed - Given irritation and symptoms, will proceed with cryotherapy as below  Was sun protection counseling provided?: Yes   Level of service outlined above   Patient instructions (SKPI)   Procedures, orders, diagnosis for this visit:  INFLAMED SEBORRHEIC KERATOSIS Right Forearm Symptomatic, irritating, patient would like treated. - Destruction of lesion - Right Forearm Complexity: simple   Destruction method: cryotherapy   Informed consent: discussed and consent obtained   Timeout:  patient name, date of birth, surgical site, and procedure verified Lesion destroyed using liquid nitrogen: Yes   Region frozen until ice ball extended beyond lesion: Yes   Cryo cycles: 1 or 2. Outcome: patient tolerated procedure well with  no complications   Post-procedure details: wound care instructions given     Inflamed seborrheic keratosis -     Destruction of lesion    Return to clinic: Return in about 6 months (around 11/05/2024) for TBSE.  I, Emerick Ege, CMA am acting as scribe for Lauraine JAYSON Kanaris, MD.   Documentation: I have reviewed the above documentation for accuracy and completeness, and I agree with the above.  Lauraine JAYSON Kanaris, MD  "

## 2024-11-03 ENCOUNTER — Ambulatory Visit
